# Patient Record
Sex: Female | Born: 1940 | Race: Black or African American | Hispanic: No | Smoking: Former smoker
Health system: Southern US, Community
[De-identification: ages and names within clinical notes are randomized; demographics above are authoritative.]

## PROBLEM LIST (undated history)

## (undated) DIAGNOSIS — K219 Gastro-esophageal reflux disease without esophagitis: Secondary | ICD-10-CM

## (undated) DIAGNOSIS — E119 Type 2 diabetes mellitus without complications: Secondary | ICD-10-CM

## (undated) DIAGNOSIS — N189 Chronic kidney disease, unspecified: Secondary | ICD-10-CM

## (undated) DIAGNOSIS — E785 Hyperlipidemia, unspecified: Secondary | ICD-10-CM

## (undated) DIAGNOSIS — I2699 Other pulmonary embolism without acute cor pulmonale: Secondary | ICD-10-CM

## (undated) DIAGNOSIS — R002 Palpitations: Secondary | ICD-10-CM

## (undated) DIAGNOSIS — M797 Fibromyalgia: Secondary | ICD-10-CM

## (undated) DIAGNOSIS — M1990 Unspecified osteoarthritis, unspecified site: Secondary | ICD-10-CM

## (undated) DIAGNOSIS — I10 Essential (primary) hypertension: Secondary | ICD-10-CM

## (undated) DIAGNOSIS — C801 Malignant (primary) neoplasm, unspecified: Secondary | ICD-10-CM

## (undated) HISTORY — DX: Fibromyalgia: M79.7

## (undated) HISTORY — PX: LEG SURGERY: SHX1003

## (undated) HISTORY — PX: OTHER SURGICAL HISTORY: SHX169

## (undated) HISTORY — PX: ABDOMINAL HYSTERECTOMY: SHX81

## (undated) HISTORY — DX: Hyperlipidemia, unspecified: E78.5

## (undated) HISTORY — PX: HAND SURGERY: SHX662

---

## 1998-01-28 ENCOUNTER — Other Ambulatory Visit: Admission: RE | Admit: 1998-01-28 | Discharge: 1998-01-28 | Payer: Self-pay | Admitting: Internal Medicine

## 1998-12-31 ENCOUNTER — Other Ambulatory Visit: Admission: RE | Admit: 1998-12-31 | Discharge: 1998-12-31 | Payer: Self-pay | Admitting: Internal Medicine

## 1999-09-17 ENCOUNTER — Ambulatory Visit (HOSPITAL_COMMUNITY): Admission: RE | Admit: 1999-09-17 | Discharge: 1999-09-17 | Payer: Self-pay | Admitting: Geriatric Medicine

## 2000-01-14 ENCOUNTER — Encounter: Payer: Self-pay | Admitting: Internal Medicine

## 2000-01-14 ENCOUNTER — Encounter: Admission: RE | Admit: 2000-01-14 | Discharge: 2000-01-14 | Payer: Self-pay | Admitting: Internal Medicine

## 2001-01-16 ENCOUNTER — Encounter: Admission: RE | Admit: 2001-01-16 | Discharge: 2001-01-16 | Payer: Self-pay | Admitting: Internal Medicine

## 2001-01-16 ENCOUNTER — Encounter: Payer: Self-pay | Admitting: Internal Medicine

## 2002-01-17 ENCOUNTER — Encounter: Admission: RE | Admit: 2002-01-17 | Discharge: 2002-01-17 | Payer: Self-pay | Admitting: Internal Medicine

## 2002-01-17 ENCOUNTER — Encounter: Payer: Self-pay | Admitting: Internal Medicine

## 2002-01-28 ENCOUNTER — Encounter: Payer: Self-pay | Admitting: Internal Medicine

## 2002-01-28 ENCOUNTER — Ambulatory Visit (HOSPITAL_COMMUNITY): Admission: RE | Admit: 2002-01-28 | Discharge: 2002-01-28 | Payer: Self-pay | Admitting: Internal Medicine

## 2002-08-12 ENCOUNTER — Emergency Department (HOSPITAL_COMMUNITY): Admission: EM | Admit: 2002-08-12 | Discharge: 2002-08-12 | Payer: Self-pay | Admitting: Emergency Medicine

## 2003-03-17 ENCOUNTER — Ambulatory Visit (HOSPITAL_COMMUNITY): Admission: RE | Admit: 2003-03-17 | Discharge: 2003-03-17 | Payer: Self-pay | Admitting: Gastroenterology

## 2004-01-14 ENCOUNTER — Other Ambulatory Visit: Admission: RE | Admit: 2004-01-14 | Discharge: 2004-01-14 | Payer: Self-pay | Admitting: Internal Medicine

## 2004-08-10 ENCOUNTER — Encounter: Admission: RE | Admit: 2004-08-10 | Discharge: 2004-08-10 | Payer: Self-pay | Admitting: Internal Medicine

## 2005-10-24 ENCOUNTER — Inpatient Hospital Stay (HOSPITAL_COMMUNITY): Admission: AD | Admit: 2005-10-24 | Discharge: 2005-10-28 | Payer: Self-pay | Admitting: Internal Medicine

## 2005-10-24 ENCOUNTER — Encounter: Admission: RE | Admit: 2005-10-24 | Discharge: 2005-10-24 | Payer: Self-pay | Admitting: Internal Medicine

## 2005-12-25 ENCOUNTER — Emergency Department (HOSPITAL_COMMUNITY): Admission: EM | Admit: 2005-12-25 | Discharge: 2005-12-25 | Payer: Self-pay | Admitting: Emergency Medicine

## 2006-10-27 ENCOUNTER — Encounter: Admission: RE | Admit: 2006-10-27 | Discharge: 2006-10-27 | Payer: Self-pay | Admitting: Internal Medicine

## 2006-12-27 ENCOUNTER — Other Ambulatory Visit: Admission: RE | Admit: 2006-12-27 | Discharge: 2006-12-27 | Payer: Self-pay | Admitting: Internal Medicine

## 2007-01-18 ENCOUNTER — Encounter: Admission: RE | Admit: 2007-01-18 | Discharge: 2007-01-18 | Payer: Self-pay | Admitting: Internal Medicine

## 2008-09-09 ENCOUNTER — Encounter: Admission: RE | Admit: 2008-09-09 | Discharge: 2008-09-09 | Payer: Self-pay | Admitting: Internal Medicine

## 2009-12-03 ENCOUNTER — Encounter: Admission: RE | Admit: 2009-12-03 | Discharge: 2009-12-03 | Payer: Self-pay | Admitting: Internal Medicine

## 2010-10-26 ENCOUNTER — Other Ambulatory Visit: Payer: Self-pay | Admitting: Obstetrics and Gynecology

## 2010-10-26 ENCOUNTER — Other Ambulatory Visit: Payer: Self-pay | Admitting: Urology

## 2010-10-26 ENCOUNTER — Encounter (HOSPITAL_COMMUNITY): Payer: Medicare Other

## 2010-10-26 LAB — CBC
Platelets: 231 10*3/uL (ref 150–400)
RBC: 3.99 MIL/uL (ref 3.87–5.11)
RDW: 13.5 % (ref 11.5–15.5)
WBC: 6.7 10*3/uL (ref 4.0–10.5)

## 2010-10-26 LAB — BASIC METABOLIC PANEL
BUN: 27 mg/dL — ABNORMAL HIGH (ref 6–23)
Calcium: 10.5 mg/dL (ref 8.4–10.5)
Chloride: 100 mEq/L (ref 96–112)
Creatinine, Ser: 1.27 mg/dL — ABNORMAL HIGH (ref 0.4–1.2)
GFR calc Af Amer: 51 mL/min — ABNORMAL LOW (ref >60)
GFR calc non Af Amer: 42 mL/min — ABNORMAL LOW (ref >60)

## 2010-10-26 LAB — PROTIME-INR
INR: 0.93 (ref 0.00–1.49)
Prothrombin Time: 12.7 seconds (ref 11.6–15.2)

## 2010-10-26 LAB — SURGICAL PCR SCREEN: Staphylococcus aureus: POSITIVE — AB

## 2010-10-26 LAB — APTT: aPTT: 32 seconds (ref 24–37)

## 2010-11-02 ENCOUNTER — Other Ambulatory Visit: Payer: Self-pay | Admitting: Obstetrics and Gynecology

## 2010-11-02 ENCOUNTER — Ambulatory Visit (HOSPITAL_COMMUNITY): Admission: AD | Admit: 2010-11-02 | Payer: Self-pay | Source: Ambulatory Visit | Admitting: Obstetrics and Gynecology

## 2010-11-02 ENCOUNTER — Ambulatory Visit (HOSPITAL_COMMUNITY)
Admission: RE | Admit: 2010-11-02 | Discharge: 2010-11-03 | Disposition: A | Discharge status: Home / Self Care | Payer: Medicare Other | Source: Ambulatory Visit | Attending: Obstetrics and Gynecology | Admitting: Obstetrics and Gynecology

## 2010-11-02 ENCOUNTER — Ambulatory Visit (HOSPITAL_COMMUNITY): Payer: Medicare Other

## 2010-11-02 DIAGNOSIS — Z01818 Encounter for other preprocedural examination: Secondary | ICD-10-CM | POA: Insufficient documentation

## 2010-11-02 DIAGNOSIS — IMO0002 Reserved for concepts with insufficient information to code with codable children: Secondary | ICD-10-CM

## 2010-11-02 DIAGNOSIS — Z01812 Encounter for preprocedural laboratory examination: Secondary | ICD-10-CM | POA: Insufficient documentation

## 2010-11-02 DIAGNOSIS — N880 Leukoplakia of cervix uteri: Secondary | ICD-10-CM | POA: Insufficient documentation

## 2010-11-02 DIAGNOSIS — N812 Incomplete uterovaginal prolapse: Secondary | ICD-10-CM | POA: Insufficient documentation

## 2010-11-02 DIAGNOSIS — N80209 Endometriosis of unspecified fallopian tube, unspecified depth: Secondary | ICD-10-CM | POA: Insufficient documentation

## 2010-11-02 DIAGNOSIS — N802 Endometriosis of fallopian tube: Secondary | ICD-10-CM | POA: Insufficient documentation

## 2010-11-02 DIAGNOSIS — N9489 Other specified conditions associated with female genital organs and menstrual cycle: Secondary | ICD-10-CM | POA: Insufficient documentation

## 2010-11-02 LAB — BASIC METABOLIC PANEL
Calcium: 9.6 mg/dL (ref 8.4–10.5)
Chloride: 105 mEq/L (ref 96–112)
Creatinine, Ser: 1.15 mg/dL (ref 0.4–1.2)
GFR calc Af Amer: 57 mL/min — ABNORMAL LOW (ref >60)
Sodium: 139 mEq/L (ref 135–145)

## 2010-11-02 LAB — CBC
MCH: 29.9 pg (ref 26.0–34.0)
MCHC: 33.3 g/dL (ref 30.0–36.0)
Platelets: 194 10*3/uL (ref 150–400)
RBC: 3.48 MIL/uL — ABNORMAL LOW (ref 3.87–5.11)

## 2010-11-02 LAB — GLUCOSE, CAPILLARY: Glucose-Capillary: 209 mg/dL — ABNORMAL HIGH (ref 70–99)

## 2010-11-02 LAB — TYPE AND SCREEN

## 2010-11-03 LAB — BASIC METABOLIC PANEL
Chloride: 106 mEq/L (ref 96–112)
GFR calc Af Amer: 52 mL/min — ABNORMAL LOW (ref >60)
Potassium: 4.3 mEq/L (ref 3.5–5.1)

## 2010-11-03 LAB — CBC
Platelets: 202 10*3/uL (ref 150–400)
RBC: 3.24 MIL/uL — ABNORMAL LOW (ref 3.87–5.11)
WBC: 12.7 10*3/uL — ABNORMAL HIGH (ref 4.0–10.5)

## 2010-11-05 NOTE — Op Note (Signed)
   NAME:  Robyn Mathews, Robyn Mathews NO.:  [id number]   MEDICAL RECORD NO.:  [id number]                   PATIENT TYPE:  AMB   LOCATION:  ENDO                                 FACILITY:  [facility]   PHYSICIAN:  Danise Edge, M.D.                DATE OF BIRTH:  03-16-1941   DATE OF PROCEDURE:  03/17/2003  DATE OF DISCHARGE:                                 OPERATIVE REPORT   PROCEDURE:  Screening incomplete colonoscopy.   INDICATIONS FOR PROCEDURE:  Robyn Mathews is a 70 year old female born  Feb 16, 1941. Robyn Mathews is scheduled to undergo a screening colonoscopy with  polypectomy to prevent colon cancer.   ENDOSCOPIST:  Charolett Bumpers, M.D.   PREMEDICATION:  Versed 10 mg, Demerol 100 mg .   DESCRIPTION OF PROCEDURE:  After obtaining informed consent, Robyn Mathews was  placed in the left lateral decubitus position. I administered intravenous  Demerol and intravenous Versed to achieve conscious sedation for the  procedure. The patient's blood pressure, oxygen saturation and cardiac  rhythm were monitored throughout the procedure and documented in the medical  record.   Anal inspection was normal. Digital rectal exam was normal. The Olympus  adjustable pediatric colonoscope was introduced into the rectum and advanced  to the proximal ascending colon. Due to left colonic loop formation, I was  unable to intubate the cecum and examine the cecum endoscopically.  Colonic  preparation for the exam today was excellent.   RECTUM:  Normal.   SIGMOID COLON AND DESCENDING COLON:  Normal. A few small diverticula are  present.   SPLENIC FLEXURE:  Normal.   TRANSVERSE COLON:  Normal.   HEPATIC FLEXURE:  Normal.   ASCENDING COLON:  Normal.   CECUM AND ILEOCECAL VALVE:  Not examined.   ASSESSMENT:  Normal screening proctocolonoscopy to the proximal ascending  colon. I was unable to examine the cecum and ileocecal valve.    RECOMMENDATIONS:  Perform stool Hemoccult  testing. If stool Hemoccults are  positive for blood, Robyn Mathews should undergo a virtual colonoscopy performed  at [facility].  If stool guaiac slides are negative, Robyn Mathews should undergo a  repeat screening optical colonoscopy for a virtual colonoscopy in five  years.                                               Danise Edge, M.D.    MJ/MEDQ  D:  03/17/2003  T:  03/17/2003  Job:  [id number]

## 2010-11-05 NOTE — H&P (Signed)
NAMEMICA, RAMDASS NO.:  [id number]   MEDICAL RECORD NO.:  [id number]           PATIENT TYPE:   LOCATION:                                 FACILITY:   PHYSICIAN:  Georgann Housekeeper, MD      DATE OF BIRTH:  03/06/1941   DATE OF ADMISSION:  DATE OF DISCHARGE:                                HISTORY & PHYSICAL   CHIEF COMPLAINT:  Right mid back pain.   HISTORY OF PRESENT ILLNESS:  Robyn Mathews is a very pleasant 70 year old  patient of Dr. Georgann Housekeeper with a two week history of right mid back pain.  States pain has been constant, does not radiate, and is occasionally worse  with movement.  It is not pleuritic, and she is not short of breath.  She  wondered whether she strained her back but could not remember any particular  injury.  States that also during this time, she has had palpitations but  denies any chest pain, pressure, or peripheral edema.  Also denies any leg  pain.   In the office, she was found to be in sinus tachycardia with a heart rate of  115.  Subsequently, a spiral CT of the chest was obtained, which revealed a  small, nonocclusive pulmonary embolus in the branch of the left upper lobe  pulmonary artery.  She is being admitted for the same.   REVIEW OF SYSTEMS:  Denies any fevers or chills.  CHEST:  As above.  ABDOMEN:  No nausea, vomiting, or diarrhea.  GU:  Denies symptoms.  EXTREMITIES:  No edema or pain.   PAST MEDICAL HISTORY:  1.  Hypertension.  2.  Diabetes mellitus type 2.  3.  Osteoarthritis.  4.  Myxoid liposarcoma.  5.  Sickle cell trait.  6.  Hematuria, worked up by Dr. Logan Bores, urology with papillary necrosis.   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 Elesa Massed at [facility].   MEDICATIONS:  1.  Glucophage 500 mg 1 p.o. b.i.d.  2.  Aspirin 81 mg daily.  3.  Altace 5 mg 1 p.o. b.i.d.  4.  HCTZ 25 mg 1 p.o. daily.  5.  Calcium with vitamin D 1000 mg daily.   DRUG ALLERGIES:   AMOXICILLIN.   SOCIAL HISTORY:  Does not smoke.  No ETOH.  She is a retired Engineer, site.   FAMILY HISTORY:  Father died with liver problems at age 15.  Mother died  with old age at age 40.  One sister died of cerebral aneurysm in her 30s.  There is a family history of siblings with diabetes mellitus.   OBJECTIVE:  VITAL SIGNS:  Temp 99.9, pulse 110, blood pressure 180/82,  respirations 22, O2 sats 98% on room air.  GENERAL:  She is alert and oriented x3.  SKIN:  Warm and dry.  HEENT:  Eyes:  PERRLA.  EOMs are intact.  Ears, nose, and throat are  unremarkable.  NECK:  No JVD.  HEART:  Rapid S1 and S2 with heart rate of 115.  No murmur or  rub  auscultated.  LUNGS:  Clear.  ABDOMEN:  Nontender.  EXTREMITIES:  No edema.  No cords felt.  NEURO:  Cranial nerves II-XII are intact.  Gait steady.  Speech appropriate.   EKG reveals sinus tachycardia with a heart rate of 115.   CBC:  Hemoglobin 12.6, hematocrit 37.3, RBCs 4.08, MCV 91.5, MCHC 33.8, RDW  12.5, platelets 254,000, white cell count 10,200, neutrophils 72.6%, lymphs  20.6%, TSH 0.75.  Urinalysis reveals trace leukocyte esterase, +2  urobilinogen, 0-5 WBCs, no RBCs.   Spiral CT of the chest with contrast done today reveals a small nonocclusive  PE in the branch of the left upper lobe pulmonary artery.   IMPRESSION:  Pulmonary embolism.   PLAN:  Admit.  Start heparin and Coumadin per pharmacy protocol.  Hold  Glucophage for two days after a contrast media dye given.  Will start  insulin sliding scale.  Dr. Donette Larry and associates to follow.      [name]      Georgann Housekeeper, MD  Electronically Signed    TCL/MEDQ  D:  10/24/2005  T:  10/24/2005  Job:  [id number]

## 2010-11-05 NOTE — Discharge Summary (Signed)
NAMEEVALETTE, MONTROSE NO.:  [id number]   MEDICAL RECORD NO.:  [id number]          PATIENT TYPE:  INP   LOCATION:  6529                         FACILITY:  [facility]   PHYSICIAN:  Georgann Housekeeper, MD      DATE OF BIRTH:  1940-09-24   DATE OF ADMISSION:  10/24/2005  DATE OF DISCHARGE:  10/28/2005                                 DISCHARGE SUMMARY   DISCHARGE DIAGNOSES:  1.  Acute pulmonary embolism on the left pulmonary artery, small.  2.  Type 2 diabetes with mildly elevated blood sugars.  3.  Hypertension.   MEDICATIONS ON DISCHARGE:  1.  Altace 5 mg b.i.d.  2.  Hydrochlorothiazide 25 mg every day.  3.  Glucophage 500 two tablets a day.  4.  Amaryl 2 mg b.i.d. (which is new).  5.  Lovenox injection 80 mg twice a day until the INR becomes therapeutic.  6.  Coumadin 5 mg as directed.   PT INR check on Monday, Oct 31, 2005 at the office.  Follow up with me in  one week.   PROCEDURES DONE:  CT of the chest showed small left upper lobe PE.  Hypercoagulable workup including lupus anticoagulant and antithrombin III  was normal.  As far as the patient remained hemodynamically stable.  Electrolytes and blood chemistries remained stable.  Hemoglobin was 10.8  mildly decreased.   HOSPITAL COURSE:  The patient is 70 years old, history of type 2 diabetes  hypertension came to the office with some pleuritic chest discomfort,  pleuritic pain on the back, upper shoulder blade, and mild tachycardia.  No  dyspnea.  The patient had spiral CT done showing acute small left upper lobe  PE.  She had no risk factors and no prior history of __________  .  No leg  discomfort or swelling.  She does have a history of __________  her thigh  many years ago.  As far as the patient was admitted on telemetry, started on  heparin and Coumadin.  And hemodynamics remained stable.  Tachycardia  resolved.  No chest pains or shortness of breath.  The patient's INR was 1.4  at discharge.  The patient  was continued on Lovenox injections which were  switched from heparin and did well using it and will continue that at home  until the INR becomes therapeutic.  As far as the she her hemoglobin  remained stable.  Dropped down from 11 range to 10.8.  No evidence of any  bleeding.  Platelet count remained stable.  As far as her electrolytes,  remained stable.  Next issue is hypertension, remained good with hydrochlorothiazide and  Altace.  Third issue is diabetes.  Blood sugar remained a little high in the 170  range.  The patient was on Glucophage.  Amaryl was added 2 mg twice a day.  Blood sugar was down to 102.  Will follow that outpatient.   The patient is getting home health and Lovenox injections teaching.  Coumadin teaching was done.   Follow up in one week.      Georgann Housekeeper, MD  Electronically Signed     KH/MEDQ  D:  10/28/2005  T:  10/28/2005  Job:  [id number]

## 2010-11-07 NOTE — Op Note (Signed)
NAME:  Robyn Mathews, Robyn Mathews NO.:  [id number]  MEDICAL RECORD NO.:  [id number]           PATIENT TYPE:  O  LOCATION:  9303                          FACILITY:  [facility]  PHYSICIAN:  Maxie Better, M.D.DATE OF BIRTH:  1941/02/02  DATE OF PROCEDURE:  11/02/2010 DATE OF DISCHARGE:                              OPERATIVE REPORT   PREOPERATIVE DIAGNOSIS:  Second-degree to third-degree uterovaginal prolapse.  PROCEDURES:  Total vaginal hysterectomy, bilateral salpingo- oophorectomy.  POSTOPERATIVE DIAGNOSIS:  Second-degree to third-degree uterovaginal prolapse.  ANESTHESIA:  General.  SURGEON:  Maxie Better, MD  ASSISTANT:  Lenoard Aden, MD  PROCEDURE:  Under adequate general anesthesia, the patient was placed in the dorsal lithotomy position and immediately doing so, the cervix was noted to be prolapsing through the vagina at least 2 to 2-1/2 inches outside the introitus.  The patient was sterilely prepped and draped in usual fashion.  Indwelling Foley catheter was sterilely placed. Examination revealed a small uterus and what appeared to be a short vagina as the uterus had prolapsed.  The uterus was then placed in its anatomic position in order to outline the location of the bladder to vaginal reflection as well as the posterior uterus/cul-de-sac.  Once this was done, dilute solution of Pitressin was injected circumferentially at the cervicovaginal junction.  Cervicovaginal incision was then made circumferentially and Mayo scissors were then utilized to sharply dissect the vaginal mucosa off the cervical attachment.  In doing so, the anterior cul-de-sac was finally opened followed by the posterior cul-de-sac, which was then opened, but tunneled in order to try to give a length to the vagina.  Once the posterior cul-de-sac was opened, the uteroovarian ligaments were bilaterally clamped, cut, and suture ligated with 0 Vicryl.  They were very attenuated.   It was then utilized to serially clamped, cauterized, and cut the cardinal ligaments, utero-ovarian ligaments bilaterally; and when the area of the round ligament was reached, the fundus was then flipped backwards identifying both tubes and ovaries bilaterally.  Both tubes and ovaries, however, were somewhat high in the vagina.  In order to facilitate the surgery, the right uteroovarian ligament and tube were serially clamped, cauterized, and cut until the uterus was separated from its attachment to the right tube and ovary.  On the left, the infant IP ligament was serially clamped, cauterized, and cut until the left tube and ovary was then removed along with its attachment to the uterus.  Once that was done, the right tube and ovary, which had been clamped and cut and held on a clamp was then removed using serial clamping and cauterizing and cutting of its attachment to the right side wall.  At that point, Dr. McDiarmid was called.  Please see his dictated operative note for the remainder of the procedure that was done, which does include the vaginal vault suspension.  Estimated blood loss from the vaginal portion of my surgery was minimal.  Complication was none, and the patient was transferred to recovery room after the urological portion of surgery was done.     Maxie Better, M.D.     [place]/MEDQ  D:  11/02/2010  T:  11/03/2010  Job:  [id number]  Electronically Signed by Nena Jordan Camden Knotek M.D. on 11/07/2010 02:49:27 PM

## 2010-11-15 NOTE — Op Note (Signed)
NAME:  Robyn Mathews, Robyn Mathews NO.:  [id number]  MEDICAL RECORD NO.:  [id number]           PATIENT TYPE:  O  LOCATION:  [facility]                          FACILITY:  [facility]  PHYSICIAN:  Robyn Sinner, MD DATE OF BIRTH:  1940-09-17  DATE OF PROCEDURE:  11/02/2010 DATE OF DISCHARGE:                              OPERATIVE REPORT   SURGEON:  Robyn Sinner, MD.  ASSISTANT.:  Delia Chimes, NP  PREOPERATIVE DIAGNOSIS:  Uterine wall prolapse plus cystocele repair.  SURGERY:  Wall prolapse repair plus cystocele repair plus graft plus cystoscopy.  INDICATIONS:  Robyn Mathews had a hysterectomy performed by Dr. Maxie Better.  Vaginal cuff was left open.  To try to preserve vaginal length, Dr. Cherly Hensen left quite a bit of vaginal mucosa posteriorly.  The vaginal cuff was opened when I entered the room.  Uterosacral ligament were very flimsy and tagged.  Leg position was good.  Preoperative antibiotics were given.  With 2 Allis clamps placed on the anterior cuff, I made an anterior vaginal wall incision and sharply and bluntly dissected the underlying pubocervical fascia from the anterior vaginal wall to the pelvic sidewall bilaterally.  I had instilled approximately 18 cc of lidocaine epinephrine mixture prior.  There was no question the patient had a trap door deformity as well as the central defect.  I did an anatomic two-layer closure, i.e. an anterior repair reducing her central defect.  I then cystoscoped the patient.  There was excellent blue jets of indigo carmine bilaterally.  At this point, I bluntly dissected at the level of the vaginal cuff along the pelvic sidewall to feel the ischial spine bilaterally.  She had a flat ischial spine bilaterally.  Her sacrospinous ligament was little bit thicker on the left than on the right.  Using a Capio device, I placed 0 Ethibond one fingerbreadth medial to the ischial spine bilaterally in a straight line between 2  spines.  I double checked the position and I was very pleased with it.  I did a rectal examination and the sutures were away from the rectum.  There was no injury to rectum.  Using my usual technique, I placed a 0 Vicryl on a UR-6 needle through the pelvic sidewall bilaterally at the level of the urethrovesical angle.  I tapered a 10 x 6 dermal graft in the shape of a trapezoid.  I passed all 4 sutures through anatomically and the graft laid in very nicely, not under tension.  I sutured the apical aspect of my anterior repair with 2 interrupted 2-0 Vicryls to the midline of the graft.  I took down my retractors and studied her anatomy.  Utilizing a digital rectal examination, I felt that her posterior support of the rectum was actually very good but she had a lot of redundant prolapsing posterior vaginal wall mucosa.  Thinking this may be the case, I left her left Ethibond vault suspension suture in its full length.  I took down any rectovaginal fascia from the posterior vaginal wall at the level of the cuff allowing me to set the position where I was  going to suspend the redundant mucosa to her left ischial spine.  I brought the 0 Ethibond full-thickness through on her left side and sewed it down just leaving a little bit of an air knot.  This flattened the posterior vaginal wall very well and reduced her redundant mucosa.  I then cut the suture.  Uterosacral ligaments sutures were not tied together.  There was really not much strength to their tissue.  The vaginal cuff was closed with 2-0 Vicryl.  I was very pleased with the length of her vagina and the repair at the end of the case.  One-half vaginal packs with Estrace cream were applied.  At the end of the case, one of the blue stay sutures from my double ring retractor could not be found.  Approximately 30 minutes of work was utilized to finally find it.  We brought the C-arm into the room but found it just before we x-rayed  the patient.  Leg positioning was good at the end of the case.  Blue urine was drained at the end of the case.  Blood loss was less than 50 mL.  Hopefully this operation will reach [name] treatment goal.          ______________________________ Robyn Sinner, MD     SAM/MEDQ  D:  11/02/2010  T:  11/02/2010  Job:  [id number]  Electronically Signed by Robyn Martinez MD on 11/15/2010 06:14:43 PM

## 2010-12-20 ENCOUNTER — Other Ambulatory Visit: Payer: Self-pay | Admitting: Internal Medicine

## 2010-12-20 DIAGNOSIS — Z1231 Encounter for screening mammogram for malignant neoplasm of breast: Secondary | ICD-10-CM

## 2010-12-28 ENCOUNTER — Ambulatory Visit
Admission: RE | Admit: 2010-12-28 | Discharge: 2010-12-28 | Disposition: A | Discharge status: Home / Self Care | Payer: Medicare Other | Source: Ambulatory Visit | Attending: Internal Medicine | Admitting: Internal Medicine

## 2010-12-28 DIAGNOSIS — Z1231 Encounter for screening mammogram for malignant neoplasm of breast: Secondary | ICD-10-CM

## 2011-04-27 ENCOUNTER — Other Ambulatory Visit: Payer: Self-pay | Admitting: Internal Medicine

## 2011-04-27 ENCOUNTER — Ambulatory Visit
Admission: RE | Admit: 2011-04-27 | Discharge: 2011-04-27 | Disposition: A | Discharge status: Home / Self Care | Payer: Medicare Other | Source: Ambulatory Visit | Attending: Internal Medicine | Admitting: Internal Medicine

## 2011-04-27 DIAGNOSIS — M79603 Pain in arm, unspecified: Secondary | ICD-10-CM

## 2011-12-30 ENCOUNTER — Other Ambulatory Visit: Payer: Self-pay | Admitting: Internal Medicine

## 2011-12-30 DIAGNOSIS — Z1231 Encounter for screening mammogram for malignant neoplasm of breast: Secondary | ICD-10-CM

## 2012-01-17 ENCOUNTER — Ambulatory Visit: Payer: Medicare Other

## 2012-01-27 ENCOUNTER — Ambulatory Visit
Admission: RE | Admit: 2012-01-27 | Discharge: 2012-01-27 | Disposition: A | Discharge status: Home / Self Care | Payer: Medicare Other | Source: Ambulatory Visit | Attending: Internal Medicine | Admitting: Internal Medicine

## 2012-01-27 DIAGNOSIS — Z1231 Encounter for screening mammogram for malignant neoplasm of breast: Secondary | ICD-10-CM

## 2013-02-04 ENCOUNTER — Other Ambulatory Visit: Payer: Self-pay

## 2013-02-04 DIAGNOSIS — Z1231 Encounter for screening mammogram for malignant neoplasm of breast: Secondary | ICD-10-CM

## 2013-02-15 ENCOUNTER — Ambulatory Visit
Admission: RE | Admit: 2013-02-15 | Discharge: 2013-02-15 | Disposition: A | Discharge status: Home / Self Care | Payer: Medicare PPO | Source: Ambulatory Visit

## 2013-02-15 DIAGNOSIS — Z1231 Encounter for screening mammogram for malignant neoplasm of breast: Secondary | ICD-10-CM

## 2013-04-08 ENCOUNTER — Other Ambulatory Visit: Payer: Self-pay | Admitting: Gastroenterology

## 2013-04-15 ENCOUNTER — Encounter (HOSPITAL_COMMUNITY): Payer: Self-pay | Admitting: Pharmacy Technician

## 2013-04-19 ENCOUNTER — Encounter (HOSPITAL_COMMUNITY): Payer: Self-pay | Admitting: *Deleted

## 2013-05-07 ENCOUNTER — Encounter (HOSPITAL_COMMUNITY): Payer: Self-pay | Admitting: *Deleted

## 2013-05-07 ENCOUNTER — Ambulatory Visit (HOSPITAL_COMMUNITY): Payer: Medicare PPO | Admitting: Anesthesiology

## 2013-05-07 ENCOUNTER — Ambulatory Visit (HOSPITAL_COMMUNITY)
Admission: RE | Admit: 2013-05-07 | Discharge: 2013-05-07 | Disposition: A | Discharge status: Home / Self Care | Payer: Medicare PPO | Source: Ambulatory Visit | Attending: Gastroenterology | Admitting: Gastroenterology

## 2013-05-07 ENCOUNTER — Encounter (HOSPITAL_COMMUNITY): Payer: Medicare PPO | Admitting: Anesthesiology

## 2013-05-07 ENCOUNTER — Encounter (HOSPITAL_COMMUNITY)
Admission: RE | Disposition: A | Discharge status: Home / Self Care | Payer: Medicare PPO | Source: Ambulatory Visit | Attending: Gastroenterology

## 2013-05-07 DIAGNOSIS — E7800 Pure hypercholesterolemia, unspecified: Secondary | ICD-10-CM | POA: Insufficient documentation

## 2013-05-07 DIAGNOSIS — E119 Type 2 diabetes mellitus without complications: Secondary | ICD-10-CM | POA: Insufficient documentation

## 2013-05-07 DIAGNOSIS — I10 Essential (primary) hypertension: Secondary | ICD-10-CM | POA: Insufficient documentation

## 2013-05-07 DIAGNOSIS — Z86711 Personal history of pulmonary embolism: Secondary | ICD-10-CM | POA: Insufficient documentation

## 2013-05-07 DIAGNOSIS — K219 Gastro-esophageal reflux disease without esophagitis: Secondary | ICD-10-CM | POA: Insufficient documentation

## 2013-05-07 DIAGNOSIS — M1990 Unspecified osteoarthritis, unspecified site: Secondary | ICD-10-CM | POA: Insufficient documentation

## 2013-05-07 DIAGNOSIS — Z87891 Personal history of nicotine dependence: Secondary | ICD-10-CM | POA: Insufficient documentation

## 2013-05-07 DIAGNOSIS — Z1211 Encounter for screening for malignant neoplasm of colon: Secondary | ICD-10-CM | POA: Insufficient documentation

## 2013-05-07 HISTORY — DX: Essential (primary) hypertension: I10

## 2013-05-07 HISTORY — DX: Unspecified osteoarthritis, unspecified site: M19.90

## 2013-05-07 HISTORY — DX: Type 2 diabetes mellitus without complications: E11.9

## 2013-05-07 HISTORY — PX: COLONOSCOPY WITH PROPOFOL: SHX5780

## 2013-05-07 HISTORY — DX: Gastro-esophageal reflux disease without esophagitis: K21.9

## 2013-05-07 HISTORY — DX: Chronic kidney disease, unspecified: N18.9

## 2013-05-07 HISTORY — DX: Malignant (primary) neoplasm, unspecified: C80.1

## 2013-05-07 HISTORY — DX: Palpitations: R00.2

## 2013-05-07 HISTORY — DX: Other pulmonary embolism without acute cor pulmonale: I26.99

## 2013-05-07 LAB — GLUCOSE, CAPILLARY: Glucose-Capillary: 136 mg/dL — ABNORMAL HIGH (ref 70–99)

## 2013-05-07 SURGERY — COLONOSCOPY WITH PROPOFOL
Anesthesia: Monitor Anesthesia Care

## 2013-05-07 MED ORDER — LACTATED RINGERS IV SOLN
INTRAVENOUS | Status: DC | PRN
  Administered 2013-05-07: 09:00:00 via INTRAVENOUS

## 2013-05-07 MED ORDER — SODIUM CHLORIDE 0.9 % IV SOLN: INTRAVENOUS | Status: DC

## 2013-05-07 MED ORDER — PROPOFOL INFUSION 10 MG/ML OPTIME
INTRAVENOUS | Status: DC | PRN
  Administered 2013-05-07: 140 ug/kg/min via INTRAVENOUS

## 2013-05-07 MED ORDER — PROPOFOL 10 MG/ML IV BOLUS
INTRAVENOUS | Status: DC | PRN
  Administered 2013-05-07: 40 mg via INTRAVENOUS

## 2013-05-07 MED ORDER — LACTATED RINGERS IV SOLN
INTRAVENOUS | Status: DC
  Administered 2013-05-07: 09:00:00 via INTRAVENOUS

## 2013-05-07 SURGICAL SUPPLY — 22 items

## 2013-05-07 NOTE — Anesthesia Preprocedure Evaluation (Addendum)
Anesthesia Evaluation  Patient identified by MRN, date of birth, ID band Patient awake    Reviewed: Allergy & Precautions, H&P , NPO status , Patient's Chart, lab work & pertinent test results  Airway Mallampati: II      Dental  (+) Edentulous Upper and Partial Lower   Pulmonary former smoker,  breath sounds clear to auscultation  Pulmonary exam normal       Cardiovascular hypertension, Pt. on medications Rhythm:Regular Rate:Normal     Neuro/Psych negative neurological ROS  negative psych ROS   GI/Hepatic Neg liver ROS, GERD-  ,  Endo/Other  diabetes, Type 2, Oral Hypoglycemic Agents  Renal/GU Renal InsufficiencyRenal disease  negative genitourinary   Musculoskeletal negative musculoskeletal ROS (+)   Abdominal   Peds  Hematology negative hematology ROS (+)   Anesthesia Other Findings   Reproductive/Obstetrics                         Anesthesia Physical Anesthesia Plan  ASA: II  Anesthesia Plan: MAC   Post-op Pain Management:    Induction: Intravenous  Airway Management Planned: Simple Face Mask  Additional Equipment:   Intra-op Plan:   Post-operative Plan:   Informed Consent: I have reviewed the patients History and Physical, chart, labs and discussed the procedure including the risks, benefits and alternatives for the proposed anesthesia with the patient or authorized representative who has indicated his/her understanding and acceptance.   Dental advisory given  Plan Discussed with: CRNA  Anesthesia Plan Comments:         Anesthesia Quick Evaluation

## 2013-05-07 NOTE — H&P (Signed)
  Procedure: Screening colonoscopy  History: The patient is a 72 year old female born Aug 13, 1940. On 03/17/2003, the patient underwent a normal screening incomplete colonoscopy to the proximal ascending colon.  The patient is scheduled to undergo a repeat screening colonoscopy today.  Past medical history: Hypertension. Type 2 diabetes mellitus. Osteoarthritis. Gastroesophageal reflux. Hypercholesterolemia. History of pulmonary embolism. Fibromyalgia syndrome. Microscopic hematuria. Liposarcoma removed from the right leg. Osteopenia. Uterine prolapse. Urinary bladder incontinence. Total abdominal hysterectomy. Bilateral salpingo-oophorectomy. Gout. Bladder tuck surgery.  Allergies: Amoxicillin caused rash. Zocor caused elevation in the muscle enzymes. Tramadol caused nausea.  Exam: The patient is alert and lying comfortably on the endoscopy stretcher. Abdomen is soft and nontender to palpation. Lungs are clear to auscultation. Cardiac exam reveals a regular rhythm  Plan: Proceed with screening colonoscopy.

## 2013-05-07 NOTE — Progress Notes (Addendum)
Pt c/o suprapubic pain a 5/10. MD made aware. Pt describes the pain as a piercing pain.

## 2013-05-07 NOTE — Op Note (Signed)
Procedure: Screening colonoscopy.  Endoscopist: Danise Edge  Premedication: Propofol administered by anesthesia  Procedure: The patient was placed in the left lateral decubitus position. Anal inspection and digital rectal exam were normal. The Pentax pediatric colonoscope was introduced into the rectum and advanced to the cecum. A normal-appearing appendiceal orifice was identified. Colonic preparation for the exam today was good.  Rectum. Normal. Retroflexed view of the distal rectum normal  Sigmoid colon and descending colon. Normal  Splenic flexure. Normal  Transverse colon. Normal  Hepatic flexure. Normal  Ascending colon. Normal  Cecum and ileocecal valve. Normal  Assessment: Normal screening proctocolonoscopy to the cecum.

## 2013-05-07 NOTE — Transfer of Care (Signed)
Immediate Anesthesia Transfer of Care Note  Patient: Robyn Mathews  Procedure(s) Performed: Procedure(s): COLONOSCOPY WITH PROPOFOL (N/A)  Patient Location: PACU and Endoscopy Unit  Anesthesia Type:MAC  Level of Consciousness: awake and alert   Airway & Oxygen Therapy: Patient Spontanous Breathing and Patient connected to face mask oxygen  Post-op Assessment: Report given to PACU RN and Post -op Vital signs reviewed and stable  Post vital signs: Reviewed and stable  Complications: No apparent anesthesia complications

## 2013-05-08 ENCOUNTER — Encounter (HOSPITAL_COMMUNITY): Payer: Self-pay | Admitting: Gastroenterology

## 2013-05-08 NOTE — Anesthesia Postprocedure Evaluation (Signed)
Anesthesia Post Note  Patient: Robyn Mathews  Procedure(s) Performed: Procedure(s) (LRB): COLONOSCOPY WITH PROPOFOL (N/A)  Anesthesia type: MAC  Patient location: PACU  Post pain: Pain level controlled  Post assessment: Post-op Vital signs reviewed  Last Vitals:  Filed Vitals:   05/07/13 1026  BP: 165/78  Pulse:   Temp:   Resp:     Post vital signs: Reviewed  Level of consciousness: sedated  Complications: No apparent anesthesia complications

## 2013-08-29 ENCOUNTER — Encounter (HOSPITAL_COMMUNITY): Payer: Self-pay | Admitting: Emergency Medicine

## 2013-08-29 ENCOUNTER — Emergency Department (HOSPITAL_COMMUNITY)
Admission: EM | Admit: 2013-08-29 | Discharge: 2013-08-29 | Disposition: A | Discharge status: Home / Self Care | Payer: Medicare PPO | Attending: Emergency Medicine | Admitting: Emergency Medicine

## 2013-08-29 DIAGNOSIS — Z79899 Other long term (current) drug therapy: Secondary | ICD-10-CM | POA: Insufficient documentation

## 2013-08-29 DIAGNOSIS — Z859 Personal history of malignant neoplasm, unspecified: Secondary | ICD-10-CM | POA: Insufficient documentation

## 2013-08-29 DIAGNOSIS — M12869 Other specific arthropathies, not elsewhere classified, unspecified knee: Secondary | ICD-10-CM | POA: Insufficient documentation

## 2013-08-29 DIAGNOSIS — Z880 Allergy status to penicillin: Secondary | ICD-10-CM | POA: Insufficient documentation

## 2013-08-29 DIAGNOSIS — E119 Type 2 diabetes mellitus without complications: Secondary | ICD-10-CM | POA: Insufficient documentation

## 2013-08-29 DIAGNOSIS — R002 Palpitations: Secondary | ICD-10-CM | POA: Insufficient documentation

## 2013-08-29 DIAGNOSIS — N183 Chronic kidney disease, stage 3 unspecified: Secondary | ICD-10-CM | POA: Insufficient documentation

## 2013-08-29 DIAGNOSIS — I129 Hypertensive chronic kidney disease with stage 1 through stage 4 chronic kidney disease, or unspecified chronic kidney disease: Secondary | ICD-10-CM | POA: Insufficient documentation

## 2013-08-29 DIAGNOSIS — Z87891 Personal history of nicotine dependence: Secondary | ICD-10-CM | POA: Insufficient documentation

## 2013-08-29 DIAGNOSIS — IMO0002 Reserved for concepts with insufficient information to code with codable children: Secondary | ICD-10-CM

## 2013-08-29 DIAGNOSIS — M109 Gout, unspecified: Secondary | ICD-10-CM | POA: Insufficient documentation

## 2013-08-29 DIAGNOSIS — Z7982 Long term (current) use of aspirin: Secondary | ICD-10-CM | POA: Insufficient documentation

## 2013-08-29 DIAGNOSIS — Z86711 Personal history of pulmonary embolism: Secondary | ICD-10-CM | POA: Insufficient documentation

## 2013-08-29 LAB — CBC
HEMATOCRIT: 36.6 % (ref 36.0–46.0)
Hemoglobin: 12.6 g/dL (ref 12.0–15.0)
MCH: 30.7 pg (ref 26.0–34.0)
MCHC: 34.4 g/dL (ref 30.0–36.0)
MCV: 89.3 fL (ref 78.0–100.0)
Platelets: 228 10*3/uL (ref 150–400)
RBC: 4.1 MIL/uL (ref 3.87–5.11)
RDW: 13.3 % (ref 11.5–15.5)
WBC: 9.8 10*3/uL (ref 4.0–10.5)

## 2013-08-29 LAB — BASIC METABOLIC PANEL
BUN: 22 mg/dL (ref 6–23)
CO2: 23 mEq/L (ref 19–32)
Calcium: 10.3 mg/dL (ref 8.4–10.5)
Chloride: 100 mEq/L (ref 96–112)
Creatinine, Ser: 1.05 mg/dL (ref 0.50–1.10)
GFR, EST AFRICAN AMERICAN: 60 mL/min — AB (ref >90)
GFR, EST NON AFRICAN AMERICAN: 52 mL/min — AB (ref >90)
Glucose, Bld: 139 mg/dL — ABNORMAL HIGH (ref 70–99)
Potassium: 3.8 mEq/L (ref 3.7–5.3)
Sodium: 139 mEq/L (ref 137–147)

## 2013-08-29 LAB — D-DIMER, QUANTITATIVE: D-Dimer, Quant: <0.27 ug/mL-FEU (ref 0.00–0.48)

## 2013-08-29 LAB — I-STAT TROPONIN, ED: Troponin i, poc: 0 ng/mL (ref 0.00–0.08)

## 2013-08-29 NOTE — ED Notes (Signed)
RN called lab on D-Dimer results delay. Per Lab, results should be in the computer in approximately 10 minutes.Family notified.

## 2013-08-29 NOTE — Discharge Instructions (Signed)
Palpitations   A palpitation is the feeling that your heartbeat is irregular or is faster than normal. It may feel like your heart is fluttering or skipping a beat. Palpitations are usually not a serious problem. However, in some cases, you may need further medical evaluation.  CAUSES   Palpitations can be caused by:   Smoking.   Caffeine or other stimulants, such as diet pills or energy drinks.   Alcohol.   Stress and anxiety.   Strenuous physical activity.   Fatigue.   Certain medicines.   Heart disease, especially if you have a history of arrhythmias. This includes atrial fibrillation, atrial flutter, or supraventricular tachycardia.   An improperly working pacemaker or defibrillator.  DIAGNOSIS   To find the cause of your palpitations, your caregiver will take your history and perform a physical exam. Tests may also be done, including:   Electrocardiography (ECG). This test records the heart's electrical activity.   Cardiac monitoring. This allows your caregiver to monitor your heart rate and rhythm in real time.   Holter monitor. This is a portable device that records your heartbeat and can help diagnose heart arrhythmias. It allows your caregiver to track your heart activity for several days, if needed.   Stress tests by exercise or by giving medicine that makes the heart beat faster.  TREATMENT   Treatment of palpitations depends on the cause of your symptoms and can vary greatly. Most cases of palpitations do not require any treatment other than time, relaxation, and monitoring your symptoms. Other causes, such as atrial fibrillation, atrial flutter, or supraventricular tachycardia, usually require further treatment.  HOME CARE INSTRUCTIONS    Avoid:   Caffeinated coffee, tea, soft drinks, diet pills, and energy drinks.   Chocolate.   Alcohol.   Stop smoking if you smoke.   Reduce your stress and anxiety. Things that can help you relax include:   A method that measures bodily functions so  you can learn to control them (biofeedback).   Yoga.   Meditation.   Physical activity such as swimming, jogging, or walking.   Get plenty of rest and sleep.  SEEK MEDICAL CARE IF:    You continue to have a fast or irregular heartbeat beyond 24 hours.   Your palpitations occur more often.  SEEK IMMEDIATE MEDICAL CARE IF:   You develop chest pain or shortness of breath.   You have a severe headache.   You feel dizzy, or you faint.  MAKE SURE YOU:   Understand these instructions.   Will watch your condition.   Will get help right away if you are not doing well or get worse.  Document Released: 06/03/2000 Document Revised: 10/01/2012 Document Reviewed: 08/05/2011  [employer] Patient Information 2014 [employer]

## 2013-08-29 NOTE — ED Provider Notes (Signed)
CSN: [id number]     Arrival date & time 08/29/13  0114 History   First MD Initiated Contact with Patient 08/29/13 0202     Chief Complaint  Patient presents with  . Palpitations      HPI Patient presents to emergency department with a complaint of palpitations.  No chest pain or shortness of breath.  She states this is occurred intermittently through the week and states that she felt it this evening.  She states she feels somewhat better at this time.  No syncope or near-syncope related to this.  No prior history of atrial fibrillation.  No active chest pain.  No history of coronary disease.  The patient does have a history of prior pulmonary emboli in 2006 and reports this feels somewhat similar tabs she felt at that time.  No unilateral leg swelling.  No recent surgery or long travel.  No other complaints.   Past Medical History  Diagnosis Date  . Diabetes mellitus without complication   . Pulmonary emboli     5'07  . Heart palpitations     occ., not a problem  . Hypertension   . Chronic kidney disease     stage 3 kidney disease-being followed Dr. Deforest Hoyles  . GERD (gastroesophageal reflux disease)   . Arthritis     knees, toes  . Cancer     muscle right leg-surgery   Past Surgical History  Procedure Laterality Date  . Bladder tack    . Leg surgery Right     8'03-growth removed from muscle(cancer)-surgery only  . Abdominal hysterectomy      '13  . Hand surgery Right     growth removed little finger  . Colonoscopy with propofol N/A 05/07/2013    Procedure: COLONOSCOPY WITH PROPOFOL;  Surgeon: Garlan Fair, MD;  Location: [facility];  Service: Endoscopy;  Laterality: N/A;   History reviewed. No pertinent family history. History  Substance Use Topics  . Smoking status: Former Smoker -- 10 years    Types: Cigarettes    Quit date: 04/19/1974  . Smokeless tobacco: Not on file  . Alcohol Use: No   OB History   Grav Para Term Preterm Abortions TAB SAB Ect Mult Living                  Review of Systems  All other systems reviewed and are negative.      Allergies  Amoxicillin  Home Medications   Current Outpatient Rx  Name  Route  Sig  Dispense  Refill  . acetaminophen (TYLENOL) 325 MG tablet   Oral   Take 650 mg by mouth every 6 (six) hours as needed for pain.         Marland Kitchen allopurinol (ZYLOPRIM) 100 MG tablet   Oral   Take 200 mg by mouth daily.         Marland Kitchen amLODipine (NORVASC) 5 MG tablet   Oral   Take 5 mg by mouth daily.         Marland Kitchen aspirin 81 MG tablet   Oral   Take 81 mg by mouth daily.         . hydrochlorothiazide (HYDRODIURIL) 25 MG tablet   Oral   Take 12.5 mg by mouth daily.         . metFORMIN (GLUCOPHAGE) 1000 MG tablet   Oral   Take 500 mg by mouth 2 (two) times daily with a meal.         . ramipril (  ALTACE) 10 MG capsule   Oral   Take 10 mg by mouth daily.          BP 110/53  Pulse 98  Temp(Src) 98.4 F (36.9 C)  Resp 17  Ht 5\' 6"  (1.676 m)  Wt 156 lb 4.8 oz (70.897 kg)  BMI 25.24 kg/m2  SpO2 98% Physical Exam  Nursing note and vitals reviewed. Constitutional: She is oriented to person, place, and time. She appears well-developed and well-nourished. No distress.  HENT:  Head: Normocephalic and atraumatic.  Eyes: EOM are normal.  Neck: Normal range of motion.  Cardiovascular: Regular rhythm and normal heart sounds.   Tachycardia  Pulmonary/Chest: Effort normal and breath sounds normal.  Abdominal: Soft. She exhibits no distension. There is no tenderness.  Musculoskeletal: Normal range of motion.  Neurological: She is alert and oriented to person, place, and time.  Skin: Skin is warm and dry.  Psychiatric: She has a normal mood and affect. Judgment normal.    ED Course  Procedures (including critical care time) Labs Review Labs Reviewed  BASIC METABOLIC PANEL - Abnormal; Notable for the following:    Glucose, Bld 139 (*)    GFR calc non Af Amer 52 (*)    GFR calc Af Amer 60 (*)     All other components within normal limits  CBC  D-DIMER, QUANTITATIVE  I-STAT TROPOININ, ED   Imaging Review No results found.   EKG Interpretation   Date/Time:  Thursday August 29 2013 01:18:05 EDT Ventricular Rate:  130 PR Interval:  134 QRS Duration: 78 QT Interval:  310 QTC Calculation: 456 R Axis:   4 Text Interpretation:  Sinus tachycardia Cannot rule out Inferior infarct ,  age undetermined Abnormal ECG No significant change was found Confirmed by  Aftin Lye  MD, Lennette Bihari ([id number]) on 08/29/2013 1:21:30 AM      MDM   Final diagnoses:  Palpitations    Patient was observed in the emergency department.  Telemetry was reviewed and no abnormal heart rhythms were noted.  She could have paroxysmal atrial fibrillation.  Outpatient cardiology followup.  Patient may benefit from a Holter monitor.  D-dimer negative.  Patient is feeling better at this time.  Heart rate was 95 at time of discharge when I discussed the patient's results with her and her family member    Hoy Morn, MD 08/29/13 [phone]

## 2013-08-29 NOTE — ED Notes (Signed)
Pt states she feels like her heart is racing. Denies CP or SOB

## 2013-08-29 NOTE — ED Notes (Signed)
Family updated on labs that have resulted. Notified that we are waiting on D-Dimer results.

## 2013-09-16 ENCOUNTER — Encounter: Payer: Self-pay | Admitting: Cardiology

## 2013-09-16 ENCOUNTER — Ambulatory Visit (INDEPENDENT_AMBULATORY_CARE_PROVIDER_SITE_OTHER): Payer: Medicare PPO | Admitting: Cardiology

## 2013-09-16 VITALS — BP 180/80 | HR 118 | Ht 66.0 in | Wt 156.0 lb

## 2013-09-16 DIAGNOSIS — I498 Other specified cardiac arrhythmias: Secondary | ICD-10-CM

## 2013-09-16 DIAGNOSIS — I10 Essential (primary) hypertension: Secondary | ICD-10-CM

## 2013-09-16 DIAGNOSIS — R000 Tachycardia, unspecified: Secondary | ICD-10-CM

## 2013-09-16 DIAGNOSIS — R002 Palpitations: Secondary | ICD-10-CM

## 2013-09-16 LAB — TSH: TSH: 1.09 u[IU]/mL (ref 0.35–5.50)

## 2013-09-16 MED ORDER — METOPROLOL SUCCINATE ER 25 MG PO TB24: 25.0000 mg | ORAL_TABLET | Freq: Every day | ORAL | Status: DC

## 2013-09-16 NOTE — Progress Notes (Signed)
[place], [place], [place]  [address] Phone: [phone] Fax:  [phone]  Date:  09/16/2013   ID:  Robyn Mathews, DOB February 20, 1941, MRN [id number]  PCP:  Wenda Low, MD  Cardiologist:  Fransico Him ,MD  E   History of Present Illness: Robyn Mathews is a 73 y.o. female with a history of DM, HTN, dyslipidemia, GERD and CKD who presents today for evaluation of palpitations.  She was seen in the ER 08/29/2013 with complaints of palpitations but no chest pain or SOB.  It is intermittent but has not had any dizziness or syncope.  She has no history of arrhythmias in the past.  EKG in the ER showed sinus tachycardia at 130bpm initially and HR came down to 95bpm.  D-Dimer as negative.  She is now here for further evaluation.  She says that her palpitations are very sporadic and only last a few seconds and resolve.  She denies any LE edema.  She says that she gets very nervous and then her HR goes up.  She drinks 2 cups of coffee in the am daily but no soda.   Wt Readings from Last 3 Encounters:  09/16/13 156 lb (70.761 kg)  08/29/13 156 lb 4.8 oz (70.897 kg)  05/07/13 160 lb (72.576 kg)     Past Medical History  Diagnosis Date  . Diabetes mellitus without complication   . Pulmonary emboli     5'07  . Heart palpitations     occ., not a problem  . Hypertension   . Chronic kidney disease     stage 3 kidney disease-being followed Dr. Deforest Hoyles  . GERD (gastroesophageal reflux disease)   . Arthritis     knees, toes  . Cancer     muscle right leg-surgery  . Hyperlipidemia     statin intolerant  . Fibromyalgia     Current Outpatient Prescriptions  Medication Sig Dispense Refill  . acetaminophen (TYLENOL) 325 MG tablet Take 650 mg by mouth every 6 (six) hours as needed for pain.      Marland Kitchen allopurinol (ZYLOPRIM) 100 MG tablet Take 200 mg by mouth daily.      Marland Kitchen amLODipine (NORVASC) 5 MG tablet Take 5 mg by mouth daily.      Marland Kitchen aspirin 81 MG tablet Take 81 mg by mouth daily.       . hydrochlorothiazide (HYDRODIURIL) 25 MG tablet Take 12.5 mg by mouth daily.      . metFORMIN (GLUCOPHAGE) 1000 MG tablet Take 500 mg by mouth 2 (two) times daily with a meal.      . ramipril (ALTACE) 10 MG capsule Take 10 mg by mouth daily.       No current facility-administered medications for this visit.    Allergies:    Allergies  Allergen Reactions  . Amoxicillin Other (See Comments)    Unknown-"skin curls up"    Social History:  The patient  reports that she quit smoking about 39 years ago. Her smoking use included Cigarettes. She smoked 0.00 packs per day for 10 years. She does not have any smokeless tobacco history on file. She reports that she does not drink alcohol or use illicit drugs.   Family History:  The patient's family history is not on file.   ROS:  Please see the history of present illness.      All other systems reviewed and negative.   PHYSICAL EXAM: VS:  BP 180/80  Pulse 118  Ht 5\' 6"  (  1.676 m)  Wt 156 lb (70.761 kg)  BMI 25.19 kg/m2 Well nourished, well developed, in no acute distress HEENT: normal Neck: no JVD Cardiac:  normal S1, S2; RRR; no murmurtachycardia Lungs:  clear to auscultation bilaterally, no wheezing, rhonchi or rales Abd: soft, nontender, no hepatomegaly Ext: no edema Skin: warm and dry Neuro:  CNs 2-12 intact, no focal abnormalities noted  EKG:  Sinus tachycardia at  131bpm with inferior infarct    ASSESSMENT AND PLAN:  1. Palpitations with sinus tachycardia on 12 lead EKG - check 24 hour Holter monitor to assess for average HR during a 24 hour period - check TSH - Lifewatch monitor to evaluate for other arrhythmias such as PAF 2.  HTN - poorly controlled - start Toprol XL 25mg  daily 3.  Sinus tachycardia of ? Etiology - ? Anxiety vs. Hyperthyroidism vs. LV dysfunction - check TSH - check 2D echo to assess LVF  Followup with my nurse on 4/2 for EKG check and with me in 4 weeks  Signed, Fransico Him, MD 09/16/2013 3:03 PM

## 2013-09-16 NOTE — Patient Instructions (Addendum)
Your physician has recommended you make the following change in your medication: 1. Start Toprol XL 25 MG 1 tablet daily  Your physician recommends that you go to the lab today for a TSH  Your physician has requested that you have an echocardiogram. Echocardiography is a painless test that uses sound waves to create images of your heart. It provides your doctor with information about the size and shape of your heart and how well your heart's chambers and valves are working. This procedure takes approximately one hour. There are no restrictions for this procedure.  Your physician has recommended that you wear a holter monitor. Holter monitors are medical devices that record the heart's electrical activity. Doctors most often use these monitors to diagnose arrhythmias. Arrhythmias are problems with the speed or rhythm of the heartbeat. The monitor is a small, portable device. You can wear one while you do your normal daily activities. This is usually used to diagnose what is causing palpitations/syncope (passing out). ( Lifetch monitor that goes from 24 hour to 30 day monitor.)  Your physician has recommended that you wear an event monitor. Event monitors are medical devices that record the heart's electrical activity. Doctors most often [place] these monitors to diagnose arrhythmias. Arrhythmias are problems with the speed or rhythm of the heartbeat. The monitor is a small, portable device. You can wear one while you do your normal daily activities. This is usually used to diagnose what is causing palpitations/syncope (passing out). ( Lifetch monitor that goes from 24 hour to 30 day monitor.)  Your physician recommends that you schedule a follow-up appointment on Thursday April 2nd for a nurse visit and EKG. This has to be fit in per Dr Radford Pax  Your physician recommends that you schedule a follow-up appointment in: 4 weeks with Dr Radford Pax

## 2013-09-18 ENCOUNTER — Other Ambulatory Visit (HOSPITAL_COMMUNITY): Payer: Medicare PPO

## 2013-09-19 ENCOUNTER — Encounter (INDEPENDENT_AMBULATORY_CARE_PROVIDER_SITE_OTHER): Payer: Medicare PPO

## 2013-09-19 ENCOUNTER — Ambulatory Visit (HOSPITAL_COMMUNITY): Payer: Medicare PPO | Attending: Cardiovascular Disease | Admitting: Radiology

## 2013-09-19 ENCOUNTER — Ambulatory Visit (INDEPENDENT_AMBULATORY_CARE_PROVIDER_SITE_OTHER): Payer: Medicare PPO | Admitting: *Deleted

## 2013-09-19 ENCOUNTER — Encounter: Payer: Self-pay | Admitting: Cardiovascular Disease

## 2013-09-19 ENCOUNTER — Encounter: Payer: Self-pay | Admitting: *Deleted

## 2013-09-19 VITALS — BP 148/78 | HR 84 | Wt 156.1 lb

## 2013-09-19 DIAGNOSIS — I498 Other specified cardiac arrhythmias: Secondary | ICD-10-CM

## 2013-09-19 DIAGNOSIS — R002 Palpitations: Secondary | ICD-10-CM

## 2013-09-19 DIAGNOSIS — R000 Tachycardia, unspecified: Secondary | ICD-10-CM

## 2013-09-19 DIAGNOSIS — R9431 Abnormal electrocardiogram [ECG] [EKG]: Secondary | ICD-10-CM

## 2013-09-19 NOTE — Progress Notes (Signed)
Patient ID: Robyn Mathews, female   DOB: 1940/09/27, 73 y.o.   MRN: [id number] 24 hour E-Cardio holter monitor applied to patient.

## 2013-09-19 NOTE — Progress Notes (Signed)
1.) Reason for visit: EKG  2.) Name of MD requesting visit: Dr. Fransico Him  3.) H&P: History of DM,HTN,Dyslipidemia,Gerd,CKD, palpitations with sinus tachycardia  4.) ROS related to problem:  No c/o.  Feeling a little tired  5.) Assessment and plan per MD: Per last OV started on Toprol XL 25 mg daily, lifewatch monitor placed, echo today, f/u with Dr. Radford Pax 4/23 at 11:15.  EKG done today showing NSR with HR 84.. Reviewed by DOD-Dr. Lovena Le  6.) Provider sign-of(MD statement): Agree with above. GT

## 2013-09-19 NOTE — Progress Notes (Signed)
Echocardiogram performed.  

## 2013-09-20 ENCOUNTER — Encounter: Payer: Self-pay | Admitting: Radiology

## 2013-09-20 ENCOUNTER — Encounter (INDEPENDENT_AMBULATORY_CARE_PROVIDER_SITE_OTHER): Payer: Medicare PPO

## 2013-09-20 DIAGNOSIS — I498 Other specified cardiac arrhythmias: Secondary | ICD-10-CM

## 2013-09-20 DIAGNOSIS — R002 Palpitations: Secondary | ICD-10-CM

## 2013-09-20 NOTE — Progress Notes (Signed)
Patient ID: Robyn Mathews, female   DOB: 16-Mar-1941, 73 y.o.   MRN: [id number] E cardio 30 day monitor applied

## 2013-09-23 ENCOUNTER — Telehealth: Payer: Self-pay | Admitting: Cardiology

## 2013-09-23 NOTE — Telephone Encounter (Signed)
New message ° ° ° ° ° °Returning a nurses call °

## 2013-09-23 NOTE — Telephone Encounter (Signed)
Returned call to patient she stated someone already called her with echo results.

## 2013-09-24 ENCOUNTER — Telehealth: Payer: Self-pay | Admitting: Cardiology

## 2013-09-24 NOTE — Telephone Encounter (Signed)
Please let patient know that heart monitor showed NSR with occasional extra heart beats from the bottom of the heart which are bening

## 2013-09-24 NOTE — Telephone Encounter (Signed)
Pt.notified

## 2013-10-10 ENCOUNTER — Encounter: Payer: Self-pay | Admitting: Cardiology

## 2013-10-10 ENCOUNTER — Ambulatory Visit (INDEPENDENT_AMBULATORY_CARE_PROVIDER_SITE_OTHER): Payer: Medicare PPO | Admitting: Cardiology

## 2013-10-10 VITALS — BP 187/84 | HR 107 | Ht 66.0 in | Wt 160.0 lb

## 2013-10-10 DIAGNOSIS — I10 Essential (primary) hypertension: Secondary | ICD-10-CM

## 2013-10-10 DIAGNOSIS — R002 Palpitations: Secondary | ICD-10-CM

## 2013-10-10 MED ORDER — METOPROLOL SUCCINATE ER 50 MG PO TB24: 50.0000 mg | ORAL_TABLET | Freq: Every day | ORAL | Status: DC

## 2013-10-10 NOTE — Progress Notes (Signed)
[place], [address] [place], [place]  [address] Phone: [phone] Fax:  [phone]  Date:  10/10/2013   ID:  HAILYN ZARR, DOB [birth year], MRN [id number]  PCP:  Wenda Low, MD  Cardiologist:  Fransico Him, MD     History of Present Illness: MELICIA ESQUEDA is a 73 y.o. female with a history of DM, HTN, dyslipidemia, GERD and CKD who presents today for followup of palpitations. She was seen in the ER 08/29/2013 with complaints of palpitations but no chest pain or SOB. It is intermittent but has not had any dizziness or syncope. She has no history of arrhythmias in the past. EKG in the ER showed sinus tachycardia at 130bpm initially and HR came down to 95bpm. D-Dimer as negative. She says that she gets very nervous and then her HR goes up. She drinks 2 cups of coffee in the am daily but no soda.  When I saw her a few weeks ago I added Toprol for better BP control.  She now presents back today for followup.  Her event monitor showed NSR with occasional PVC's.  2D echo showed normal LVF with LVH.  She does use table salt.     Wt Readings from Last 3 Encounters:  10/10/13 160 lb (72.576 kg)  09/19/13 156 lb 1.9 oz (70.816 kg)  09/16/13 156 lb (70.761 kg)     Past Medical History  Diagnosis Date  . Diabetes mellitus without complication   . Pulmonary emboli     5'07  . Heart palpitations     occ., not a problem  . Chronic kidney disease     stage 3 kidney disease-being followed Dr. Deforest Hoyles  . GERD (gastroesophageal reflux disease)   . Arthritis     knees, toes  . Cancer     muscle right leg-surgery  . Hyperlipidemia     statin intolerant  . Fibromyalgia   . Hypertension     Current Outpatient Prescriptions  Medication Sig Dispense Refill  . acetaminophen (TYLENOL) 325 MG tablet Take 650 mg by mouth every 6 (six) hours as needed for pain.      Marland Kitchen allopurinol (ZYLOPRIM) 100 MG tablet Take 200 mg by mouth daily.      Marland Kitchen amLODipine (NORVASC) 5 MG tablet Take 5 mg by mouth  daily.      Marland Kitchen aspirin 81 MG tablet Take 81 mg by mouth daily.      . hydrochlorothiazide (HYDRODIURIL) 25 MG tablet Take 12.5 mg by mouth daily.      . metFORMIN (GLUCOPHAGE) 1000 MG tablet Take 500 mg by mouth 2 (two) times daily with a meal.      . metoprolol succinate (TOPROL-XL) 25 MG 24 hr tablet Take 1 tablet (25 mg total) by mouth daily. Take with or immediately following a meal.  30 tablet  6  . ramipril (ALTACE) 10 MG capsule Take 10 mg by mouth daily.       No current facility-administered medications for this visit.    Allergies:    Allergies  Allergen Reactions  . Amoxicillin Other (See Comments)    Unknown-"skin curls up"    Social History:  The patient  reports that she quit smoking about 39 years ago. Her smoking use included Cigarettes. She smoked 0.00 packs per day for 10 years. She does not have any smokeless tobacco history on file. She reports that she does not drink alcohol or use illicit drugs.   Family History:  The patient's family  history includes CVA in her sister; Diabetes in her sister; Hypertension in her mother.   ROS:  Please see the history of present illness.      All other systems reviewed and negative.   PHYSICAL EXAM: VS:  BP 187/84  Pulse 107  Ht 5\' 6"  (1.676 m)  Wt 160 lb (72.576 kg)  BMI 25.84 kg/m2 Well nourished, well developed, in no acute distress HEENT: normal Neck: no JVD Cardiac:  normal S1, S2; RRR; no murmur Lungs:  clear to auscultation bilaterally, no wheezing, rhonchi or rales Abd: soft, nontender, no hepatomegaly Ext: no edema Skin: warm and dry Neuro:  CNs 2-12 intact, no focal abnormalities noted      ASSESSMENT AND PLAN:  1.  Palpitations with sinus tachycardia on 12 lead EKG.  TSH was normal.  Heart monitor showed NSR with occasional PVC's.  Average HR on Holter 82bpm.  Echo showed normal LVF 2. HTN - poorly controlled with evidence of end organ damage with LVH on echo.  She needs aggressive BP treatment.   - Increase  Toprol XL to 50mg  daily  - continue Ramipril/amlodipine/HCTZ - cut all added salt in diet - nurse check in 1 week for BP eval 3. Sinus tachycardia resolved on beta blockers  Followup with me 3 months  Signed, Fransico Him, MD 10/10/2013 10:59 AM

## 2013-10-10 NOTE — Patient Instructions (Signed)
Your physician has recommended you make the following change in your medication: 1. Increase Toprol to 50 MG 1 tablet daily ( A new Rx has been sent into your pharmacy for you)  Dr Radford Pax has asked that you cut out all added salt in your diet.  Your physician recommends that you schedule a follow-up appointment in: 1 week for a BP check with nurse  Your physician recommends that you schedule a follow-up appointment in: 3 months with Dr Radford Pax

## 2013-10-17 ENCOUNTER — Encounter: Payer: Self-pay | Admitting: *Deleted

## 2013-10-17 ENCOUNTER — Ambulatory Visit (INDEPENDENT_AMBULATORY_CARE_PROVIDER_SITE_OTHER): Payer: Medicare PPO | Admitting: *Deleted

## 2013-10-17 VITALS — BP 146/82 | HR 90 | Ht 60.0 in | Wt 161.6 lb

## 2013-10-17 DIAGNOSIS — I10 Essential (primary) hypertension: Secondary | ICD-10-CM

## 2013-10-17 NOTE — Progress Notes (Signed)
1.) Reason for visit: blood pressure check  2.) Name of MD requesting visit: Dr. Radford Pax  3.) H&P: hypertension  4.) ROS related to problem: asymptomatic  5.) Assessment and plan per MD: Dr. Rayann Heman (DOD) reviewed. No changes made  6.) Provider sign-of(MD statement): BP adequately controlled - continue current meds

## 2013-10-28 ENCOUNTER — Telehealth: Payer: Self-pay | Admitting: Cardiology

## 2013-10-28 NOTE — Telephone Encounter (Signed)
New Message:  Pt is requesting a call back to hear her monitor results

## 2013-10-28 NOTE — Telephone Encounter (Signed)
Pt is aware.  

## 2013-10-28 NOTE — Telephone Encounter (Signed)
Spoke with pt and made aware we have not received final result for event monitor that was ordered for pt. Will call her once I get those from Dr Radford Pax.

## 2013-10-28 NOTE — Telephone Encounter (Signed)
Please let patient know that heart monitor showed NSR with occasional PVC's which are benign 

## 2014-01-15 ENCOUNTER — Ambulatory Visit: Payer: Medicare PPO | Admitting: Cardiology

## 2014-01-27 ENCOUNTER — Ambulatory Visit (INDEPENDENT_AMBULATORY_CARE_PROVIDER_SITE_OTHER): Payer: Medicare PPO | Admitting: Cardiology

## 2014-01-27 ENCOUNTER — Encounter: Payer: Self-pay | Admitting: Cardiology

## 2014-01-27 ENCOUNTER — Other Ambulatory Visit: Payer: Self-pay

## 2014-01-27 VITALS — BP 142/84 | HR 78 | Ht 59.0 in | Wt 162.0 lb

## 2014-01-27 DIAGNOSIS — R002 Palpitations: Secondary | ICD-10-CM

## 2014-01-27 DIAGNOSIS — Z1231 Encounter for screening mammogram for malignant neoplasm of breast: Secondary | ICD-10-CM

## 2014-01-27 DIAGNOSIS — I498 Other specified cardiac arrhythmias: Secondary | ICD-10-CM

## 2014-01-27 DIAGNOSIS — R000 Tachycardia, unspecified: Secondary | ICD-10-CM

## 2014-01-27 DIAGNOSIS — I10 Essential (primary) hypertension: Secondary | ICD-10-CM

## 2014-01-27 NOTE — Patient Instructions (Signed)
Your physician recommends that you continue on your current medications as directed. Please refer to the Current Medication list given to you today.  Your physician wants you to follow-up in: 6 months with Dr Turner You will receive a reminder letter in the mail two months in advance. If you don't receive a letter, please call our office to schedule the follow-up appointment.  

## 2014-01-27 NOTE — Progress Notes (Signed)
[place], [place], [place]  [address] Phone: [phone] Fax:  [phone]  Date:  01/27/2014   ID:  Robyn Mathews, DOB December 14, 1940, MRN [id number]  PCP:  Wenda Low, MD  Cardiologist:  Fransico Him, MD     History of Present Illness: Robyn Mathews is a 73 y.o. female with a history of DM, HTN, dyslipidemia, GERD and CKD who presents today for followup of palpitations. She was seen in the ER 08/29/2013 with complaints of palpitations but no chest pain or SOB. It is intermittent but has not had any dizziness or syncope. She has no history of arrhythmias in the past. EKG in the ER showed sinus tachycardia at 130bpm initially and HR came down to 95bpm. D-Dimer as negative. She says that she gets very nervous and then her HR goes up. She drinks 2 cups of coffee in the am daily but no soda. When I saw her last  I added Toprol for better BP control. She now presents back today for followup. Her event monitor showed NSR with occasional PVC's. 2D echo showed normal LVF with LVH. She does use table salt. She denies any chest pain, SOB, DOE, LE edema, dizziness, palpitations or syncope.    Wt Readings from Last 3 Encounters:  01/27/14 162 lb (73.483 kg)  10/17/13 161 lb 9.6 oz (73.301 kg)  10/10/13 160 lb (72.576 kg)     Past Medical History  Diagnosis Date  . Diabetes mellitus without complication   . Pulmonary emboli     5'07  . Heart palpitations     occ., not a problem  . Chronic kidney disease     stage 3 kidney disease-being followed Dr. Deforest Hoyles  . GERD (gastroesophageal reflux disease)   . Arthritis     knees, toes  . Cancer     muscle right leg-surgery  . Hyperlipidemia     statin intolerant  . Fibromyalgia   . Hypertension     Current Outpatient Prescriptions  Medication Sig Dispense Refill  . acetaminophen (TYLENOL) 325 MG tablet Take 650 mg by mouth every 6 (six) hours as needed for pain.      Marland Kitchen allopurinol (ZYLOPRIM) 100 MG tablet Take 200 mg by mouth  daily.      Marland Kitchen amLODipine (NORVASC) 5 MG tablet Take 5 mg by mouth daily.      Marland Kitchen aspirin 81 MG tablet Take 81 mg by mouth daily.      Marland Kitchen FREESTYLE LITE test strip       . hydrochlorothiazide (HYDRODIURIL) 25 MG tablet Take 12.5 mg by mouth daily.      . metFORMIN (GLUCOPHAGE) 1000 MG tablet Take 500 mg by mouth 2 (two) times daily with a meal.      . metoprolol succinate (TOPROL-XL) 50 MG 24 hr tablet Take 1 tablet (50 mg total) by mouth daily. Take with or immediately following a meal.  30 tablet  6  . ramipril (ALTACE) 10 MG capsule Take 10 mg by mouth daily.       No current facility-administered medications for this visit.    Allergies:    Allergies  Allergen Reactions  . Amoxicillin Other (See Comments)    Unknown-"skin curls up"    Social History:  The patient  reports that she quit smoking about 39 years ago. Her smoking use included Cigarettes. She smoked 0.00 packs per day for 10 years. She does not have any smokeless tobacco history on file. She reports that she  does not drink alcohol or use illicit drugs.   Family History:  The patient's family history includes CVA in her sister; Diabetes in her sister; Hypertension in her mother.   ROS:  Please see the history of present illness.      All other systems reviewed and negative.   PHYSICAL EXAM: VS:  BP 142/84  Pulse 78  Ht 4\' 11"  (1.499 m)  Wt 162 lb (73.483 kg)  BMI 32.70 kg/m2 Well nourished, well developed, in no acute distress HEENT: normal Neck: no JVD Cardiac:  normal S1, S2; RRR; no murmur Lungs:  clear to auscultation bilaterally, no wheezing, rhonchi or rales Abd: soft, nontender, no hepatomegaly Ext: no edema Skin: warm and dry Neuro:  CNs 2-12 intact, no focal abnormalities noted  ASSESSMENT AND PLAN:  1. Palpitations -TSH was normal. Heart monitor showed NSR with occasional PVC's. Average HR on Holter 82bpm. Echo showed normal LVF.  Palpitations have resolved on BB. 2. HTN -  controlled - continue  Ramipril/amlodipine/HCTZ/BB - She has a PE with her PCP later this month and she will have her PCP fax a copy of her BMET 3. Sinus tachycardia resolved on beta blockers   Followup with me 6 months       Signed, Fransico Him, MD 01/27/2014 3:24 PM

## 2014-02-07 ENCOUNTER — Encounter: Payer: Self-pay | Admitting: Cardiology

## 2014-02-17 ENCOUNTER — Ambulatory Visit
Admission: RE | Admit: 2014-02-17 | Discharge: 2014-02-17 | Disposition: A | Discharge status: Home / Self Care | Payer: Medicare PPO | Source: Ambulatory Visit

## 2014-02-17 DIAGNOSIS — Z1231 Encounter for screening mammogram for malignant neoplasm of breast: Secondary | ICD-10-CM

## 2014-04-27 ENCOUNTER — Other Ambulatory Visit: Payer: Self-pay | Admitting: Cardiology

## 2014-05-29 ENCOUNTER — Encounter: Payer: Self-pay | Admitting: Cardiology

## 2014-08-08 ENCOUNTER — Encounter (HOSPITAL_COMMUNITY): Payer: Self-pay | Admitting: *Deleted

## 2014-08-08 ENCOUNTER — Telehealth: Payer: Self-pay | Admitting: Cardiology

## 2014-08-08 ENCOUNTER — Emergency Department (HOSPITAL_COMMUNITY)
Admission: EM | Admit: 2014-08-08 | Discharge: 2014-08-08 | Disposition: A | Discharge status: Home / Self Care | Payer: Medicare PPO | Attending: Emergency Medicine | Admitting: Emergency Medicine

## 2014-08-08 DIAGNOSIS — I129 Hypertensive chronic kidney disease with stage 1 through stage 4 chronic kidney disease, or unspecified chronic kidney disease: Secondary | ICD-10-CM | POA: Insufficient documentation

## 2014-08-08 DIAGNOSIS — Z87891 Personal history of nicotine dependence: Secondary | ICD-10-CM | POA: Insufficient documentation

## 2014-08-08 DIAGNOSIS — Z86711 Personal history of pulmonary embolism: Secondary | ICD-10-CM | POA: Diagnosis not present

## 2014-08-08 DIAGNOSIS — Z859 Personal history of malignant neoplasm, unspecified: Secondary | ICD-10-CM | POA: Diagnosis not present

## 2014-08-08 DIAGNOSIS — N189 Chronic kidney disease, unspecified: Secondary | ICD-10-CM | POA: Diagnosis not present

## 2014-08-08 DIAGNOSIS — Z79899 Other long term (current) drug therapy: Secondary | ICD-10-CM | POA: Insufficient documentation

## 2014-08-08 DIAGNOSIS — I10 Essential (primary) hypertension: Secondary | ICD-10-CM

## 2014-08-08 DIAGNOSIS — M179 Osteoarthritis of knee, unspecified: Secondary | ICD-10-CM | POA: Insufficient documentation

## 2014-08-08 DIAGNOSIS — E119 Type 2 diabetes mellitus without complications: Secondary | ICD-10-CM | POA: Diagnosis not present

## 2014-08-08 DIAGNOSIS — Z880 Allergy status to penicillin: Secondary | ICD-10-CM | POA: Diagnosis not present

## 2014-08-08 DIAGNOSIS — Z7982 Long term (current) use of aspirin: Secondary | ICD-10-CM | POA: Insufficient documentation

## 2014-08-08 DIAGNOSIS — Z8719 Personal history of other diseases of the digestive system: Secondary | ICD-10-CM | POA: Diagnosis not present

## 2014-08-08 LAB — CBC WITH DIFFERENTIAL/PLATELET
Basophils Absolute: 0.1 10*3/uL (ref 0.0–0.1)
Basophils Relative: 1 % (ref 0–1)
EOS ABS: 0.1 10*3/uL (ref 0.0–0.7)
EOS PCT: 2 % (ref 0–5)
HCT: 38 % (ref 36.0–46.0)
Hemoglobin: 12.9 g/dL (ref 12.0–15.0)
LYMPHS ABS: 1.6 10*3/uL (ref 0.7–4.0)
LYMPHS PCT: 19 % (ref 12–46)
MCH: 30.4 pg (ref 26.0–34.0)
MCHC: 33.9 g/dL (ref 30.0–36.0)
MCV: 89.4 fL (ref 78.0–100.0)
Monocytes Absolute: 0.4 10*3/uL (ref 0.1–1.0)
Monocytes Relative: 5 % (ref 3–12)
NEUTROS PCT: 73 % (ref 43–77)
Neutro Abs: 5.9 10*3/uL (ref 1.7–7.7)
Platelets: 234 10*3/uL (ref 150–400)
RBC: 4.25 MIL/uL (ref 3.87–5.11)
RDW: 13.6 % (ref 11.5–15.5)
WBC: 8 10*3/uL (ref 4.0–10.5)

## 2014-08-08 LAB — BASIC METABOLIC PANEL
Anion gap: 11 (ref 5–15)
BUN: 17 mg/dL (ref 6–23)
CALCIUM: 10.3 mg/dL (ref 8.4–10.5)
CO2: 26 mmol/L (ref 19–32)
Chloride: 98 mmol/L (ref 96–112)
Creatinine, Ser: 1.07 mg/dL (ref 0.50–1.10)
GFR calc Af Amer: 59 mL/min — ABNORMAL LOW (ref >90)
GFR calc non Af Amer: 51 mL/min — ABNORMAL LOW (ref >90)
Glucose, Bld: 112 mg/dL — ABNORMAL HIGH (ref 70–99)
POTASSIUM: 3.8 mmol/L (ref 3.5–5.1)
SODIUM: 135 mmol/L (ref 135–145)

## 2014-08-08 NOTE — ED Provider Notes (Signed)
CSN: [id number]     Arrival date & time 08/08/14  1710 History   First MD Initiated Contact with Patient 08/08/14 2102     Chief Complaint  Patient presents with  . Hypertension     (Consider location/radiation/quality/duration/timing/severity/associated sxs/prior Treatment) Patient is a 74 y.o. female presenting with hypertension.  Hypertension This is a new problem. The current episode started less than 1 hour ago. The problem occurs constantly. The problem has not changed since onset.Pertinent negatives include no chest pain, no abdominal pain, no headaches and no shortness of breath. Nothing aggravates the symptoms. Nothing relieves the symptoms. She has tried nothing for the symptoms.    Past Medical History  Diagnosis Date  . Diabetes mellitus without complication   . Pulmonary emboli     5'07  . Heart palpitations     occ., not a problem  . Chronic kidney disease     stage 3 kidney disease-being followed Dr. Deforest Hoyles  . GERD (gastroesophageal reflux disease)   . Arthritis     knees, toes  . Cancer     muscle right leg-surgery  . Hyperlipidemia     statin intolerant  . Fibromyalgia   . Hypertension    Past Surgical History  Procedure Laterality Date  . Bladder tack    . Leg surgery Right     8'03-growth removed from muscle(cancer)-surgery only  . Abdominal hysterectomy      '13  . Hand surgery Right     growth removed little finger  . Colonoscopy with propofol N/A 05/07/2013    Procedure: COLONOSCOPY WITH PROPOFOL;  Surgeon: Garlan Fair, MD;  Location: [facility];  Service: Endoscopy;  Laterality: N/A;   Family History  Problem Relation Age of Onset  . Hypertension Mother   . CVA Sister   . Diabetes Sister    History  Substance Use Topics  . Smoking status: Former Smoker -- 10 years    Types: Cigarettes    Quit date: 04/19/1974  . Smokeless tobacco: Not on file  . Alcohol Use: No   OB History    No data available     Review of Systems   Respiratory: Negative for shortness of breath.   Cardiovascular: Negative for chest pain.  Gastrointestinal: Negative for abdominal pain.  Neurological: Negative for headaches.  All other systems reviewed and are negative.     Allergies  Amoxicillin  Home Medications   Prior to Admission medications   Medication Sig Start Date End Date Taking? Authorizing Provider  acetaminophen (TYLENOL) 325 MG tablet Take 650 mg by mouth every 6 (six) hours as needed for pain.   Yes Historical Provider, MD  allopurinol (ZYLOPRIM) 100 MG tablet Take 200 mg by mouth daily.   Yes Historical Provider, MD  amLODipine (NORVASC) 5 MG tablet Take 5 mg by mouth daily.   Yes Historical Provider, MD  aspirin 81 MG tablet Take 81 mg by mouth daily.   Yes Historical Provider, MD  hydrochlorothiazide (HYDRODIURIL) 25 MG tablet Take 12.5 mg by mouth daily.   Yes Historical Provider, MD  metFORMIN (GLUCOPHAGE) 1000 MG tablet Take 500 mg by mouth 2 (two) times daily with a meal.   Yes Historical Provider, MD  metoprolol succinate (TOPROL-XL) 50 MG 24 hr tablet TAKE 1 TABLET (50 MG TOTAL) BY MOUTH DAILY. TAKE WITH OR IMMEDIATELY FOLLOWING A MEAL. 04/28/14  Yes Sueanne Margarita, MD  ramipril (ALTACE) 10 MG capsule Take 10 mg by mouth daily.   Yes Historical Provider,  MD   BP 151/72 mmHg  Pulse 82  Temp(Src) 98.1 F (36.7 C) (Oral)  Resp 16  SpO2 99% Physical Exam  Constitutional: She is oriented to person, place, and time. She appears well-developed and well-nourished.  HENT:  Head: Normocephalic and atraumatic.  Right Ear: External ear normal.  Left Ear: External ear normal.  Eyes: Conjunctivae and EOM are normal. Pupils are equal, round, and reactive to light.  Neck: Normal range of motion. Neck supple.  Cardiovascular: Normal rate, regular rhythm, normal heart sounds and intact distal pulses.   Pulmonary/Chest: Effort normal and breath sounds normal.  Abdominal: Soft. Bowel sounds are normal. There is no  tenderness.  Musculoskeletal: Normal range of motion.  Neurological: She is alert and oriented to person, place, and time.  Skin: Skin is warm and dry.  Vitals reviewed.   ED Course  Procedures (including critical care time) Labs Review Labs Reviewed  BASIC METABOLIC PANEL - Abnormal; Notable for the following:    Glucose, Bld 112 (*)    GFR calc non Af Amer 51 (*)    GFR calc Af Amer 59 (*)    All other components within normal limits  CBC WITH DIFFERENTIAL/PLATELET  URINALYSIS, ROUTINE W REFLEX MICROSCOPIC    Imaging Review No results found.   EKG Interpretation   Date/Time:  Friday August 08 2014 21:10:36 EST Ventricular Rate:  110 PR Interval:  144 QRS Duration: 79 QT Interval:  329 QTC Calculation: 445 R Axis:   -7 Text Interpretation:  Sinus tachycardia Prominent P waves, nondiagnostic  No significant change since last tracing Confirmed by Debby Freiberg  [phone]) on 08/08/2014 9:33:18 PM      MDM   Final diagnoses:  Essential hypertension    74 y.o. female with pertinent PMH of HTN, DM, ckd presents with asymptomatic HTN to 485I systolic.  Physical exam benign on arrival.  Elba Barman as above unremarkable.  DC home in stable condition, bp improved without intervention.    I have reviewed all laboratory and imaging studies if ordered as above  1. Essential hypertension         Debby Freiberg, MD 08/08/14 [phone]

## 2014-08-08 NOTE — Telephone Encounter (Signed)
New message     Pt c/o BP issue: STAT if pt c/o blurred vision, one-sided weakness or slurred speech  1. What are your last 5 BP readings?  219/108 at 1:30  2. Are you having any other symptoms (ex. Dizziness, headache, blurred vision, passed out)? no  3. What is your BP issue?  NP from her ins co came out and checked her bp and did an assement.  Her bp was high.  Should she take another pill?

## 2014-08-08 NOTE — ED Notes (Signed)
The pts bp is usually well-controlled  Until today when her bp has been high

## 2014-08-08 NOTE — Telephone Encounter (Signed)
Patient rechecked BP. 224/116. Per Dr. Radford Pax, instructed patient to go to the ER. Patient agrees with treatment plan.

## 2014-08-08 NOTE — Discharge Instructions (Signed)

## 2014-08-14 DIAGNOSIS — I493 Ventricular premature depolarization: Secondary | ICD-10-CM | POA: Insufficient documentation

## 2014-08-14 NOTE — Progress Notes (Signed)
Cardiology Office Note   Date:  08/15/2014   ID:  Robyn Mathews, DOB April 15, 1941, MRN [id number]  PCP:  Wenda Low, MD  Cardiologist:   Sueanne Margarita, MD   Chief Complaint  Patient presents with  . Palpitations  . Hypertension  . Tachycardia      History of Present Illness: Robyn Mathews is a 74 y.o. female with a history of DM, HTN, dyslipidemia, GERD, palpitations with PVC's on monitor and CKD who presents today for followup. She was recently in the ER a few days ago with HTN with SBP as high as 264mmHg with no other symptoms.  Apparently a person for her health insurance came out to take her BP and she felt uncomfortable with him and she got stressed out and her BP went up to 200's.   In the ER her BP was 151/56mmHg and HR 110bpm.  She was discharged home later that day.   She denies any chest pain, SOB, DOE, LE edema, dizziness, palpitations or syncope.  She says that normally her BP runs normal at home at 130/42mmHg.      Past Medical History  Diagnosis Date  . Diabetes mellitus without complication   . Pulmonary emboli     5'07  . Heart palpitations     occ., not a problem  . Chronic kidney disease     stage 3 kidney disease-being followed Dr. Deforest Hoyles  . GERD (gastroesophageal reflux disease)   . Arthritis     knees, toes  . Cancer     muscle right leg-surgery  . Hyperlipidemia     statin intolerant  . Fibromyalgia   . Hypertension     Past Surgical History  Procedure Laterality Date  . Bladder tack    . Leg surgery Right     8'03-growth removed from muscle(cancer)-surgery only  . Abdominal hysterectomy      '13  . Hand surgery Right     growth removed little finger  . Colonoscopy with propofol N/A 05/07/2013    Procedure: COLONOSCOPY WITH PROPOFOL;  Surgeon: Garlan Fair, MD;  Location: [facility];  Service: Endoscopy;  Laterality: N/A;     Current Outpatient Prescriptions  Medication Sig Dispense Refill  . acetaminophen (TYLENOL) 325 MG  tablet Take 650 mg by mouth every 6 (six) hours as needed for pain.    Marland Kitchen allopurinol (ZYLOPRIM) 100 MG tablet Take 200 mg by mouth daily.    Marland Kitchen ALPRAZolam (XANAX) 0.5 MG tablet Take 0.5 mg by mouth as needed. For anxiety    . amLODipine (NORVASC) 5 MG tablet Take 5 mg by mouth daily.    Marland Kitchen aspirin 81 MG tablet Take 81 mg by mouth daily.    . hydrochlorothiazide (HYDRODIURIL) 25 MG tablet Take 25 mg by mouth daily.     . metFORMIN (GLUCOPHAGE) 1000 MG tablet Take 500 mg by mouth 2 (two) times daily with a meal.    . metoprolol succinate (TOPROL-XL) 50 MG 24 hr tablet TAKE 1 TABLET (50 MG TOTAL) BY MOUTH DAILY. TAKE WITH OR IMMEDIATELY FOLLOWING A MEAL. 30 tablet 3  . ramipril (ALTACE) 10 MG capsule Take 10 mg by mouth daily.     No current facility-administered medications for this visit.    Allergies:   Amoxicillin    Social History:  The patient  reports that she quit smoking about 40 years ago. Her smoking use included Cigarettes. She quit after 10 years of use. She does not have any smokeless  tobacco history on file. She reports that she does not drink alcohol or use illicit drugs.   Family History:  The patient's family history includes CVA in her sister; Diabetes in her sister; Hypertension in her mother.    ROS:  Please see the history of present illness.   Otherwise, review of systems are positive for none.   All other systems are reviewed and negative.    PHYSICAL EXAM: VS:  BP 168/78 mmHg  Pulse 114  Ht 5' (1.524 m)  Wt 161 lb 12.8 oz (73.392 kg)  BMI 31.60 kg/m2  SpO2 97% , BMI Body mass index is 31.6 kg/(m^2). GEN: Well nourished, well developed, in no acute distress HEENT: normal Neck: no JVD, carotid bruits, or masses Cardiac: RRR; no murmurs, rubs, or gallops,no edema  Respiratory:  clear to auscultation bilaterally, normal work of breathing GI: soft, nontender, nondistended, + BS MS: no deformity or atrophy Skin: warm and dry, no rash Neuro:  Strength and sensation  are intact Psych: euthymic mood, full affect   EKG:  EKG is not ordered today.    Recent Labs: 09/16/2013: TSH 1.09 08/08/2014: BUN 17; Creatinine 1.07; Hemoglobin 12.9; Platelets 234; Potassium 3.8; Sodium 135    Lipid Panel No results found for: CHOL, TRIG, HDL, CHOLHDL, VLDL, LDLCALC, LDLDIRECT    Wt Readings from Last 3 Encounters:  08/15/14 161 lb 12.8 oz (73.392 kg)  01/27/14 162 lb (73.483 kg)  10/17/13 161 lb 9.6 oz (73.301 kg)       ASSESSMENT AND PLAN:  1. Palpitations -TSH was normal. Heart monitor showed NSR with occasional PVC's. Average HR on Holter 82bpm. Echo showed normal LVF. Palpitations have resolved on BB. 2. HTN - BP elevated today but says she is very nervous. I have asked her to check her BP and HR daily for a week 2 hours after taking her meds and call with the results. - continue Ramipril/amlodipine/HCTZ/BB 3. Sinus tachycardia resolved on beta blockers   Current medicines are reviewed at length with the patient today.  The patient does not have concerns regarding medicines.  The following changes have been made:  no change  Labs/ tests ordered today include: None    Disposition:   FU with me in 6 months   Signed, Sueanne Margarita, MD  08/15/2014 8:45 AM    [facility] [address], [place], [place]  [address] Phone: [phone]; Fax: [phone]

## 2014-08-15 ENCOUNTER — Encounter: Payer: Self-pay | Admitting: Cardiology

## 2014-08-15 ENCOUNTER — Ambulatory Visit (INDEPENDENT_AMBULATORY_CARE_PROVIDER_SITE_OTHER): Payer: Medicare PPO | Admitting: Cardiology

## 2014-08-15 VITALS — BP 168/78 | HR 114 | Ht 60.0 in | Wt 161.8 lb

## 2014-08-15 DIAGNOSIS — R000 Tachycardia, unspecified: Secondary | ICD-10-CM

## 2014-08-15 DIAGNOSIS — I493 Ventricular premature depolarization: Secondary | ICD-10-CM

## 2014-08-15 DIAGNOSIS — R002 Palpitations: Secondary | ICD-10-CM

## 2014-08-15 DIAGNOSIS — I471 Supraventricular tachycardia: Secondary | ICD-10-CM

## 2014-08-15 DIAGNOSIS — I10 Essential (primary) hypertension: Secondary | ICD-10-CM

## 2014-08-15 NOTE — Patient Instructions (Signed)
Please check your blood pressure and heart rate daily for one week (ideally 2 hours after you take your medicines) and call us with the results.  Your physician wants you to follow-up in: 6 months with Dr. Radford Pax. You will receive a reminder letter in the mail two months in advance. If you don't receive a letter, please call our office to schedule the follow-up appointment.

## 2014-08-21 ENCOUNTER — Other Ambulatory Visit: Payer: Self-pay | Admitting: Cardiology

## 2014-08-22 NOTE — Telephone Encounter (Signed)
Patient called with recent BP log: 2/27: BP 155/75 HR 84 2/28: BP 146/54 HR 80 2/29: BP 139/72 HR 72 3/1:   BP 135/57 HR 79 3/2:   BP 136/72 HR 72          BP 130/69 HR 76 (later that day) 3/3:   BP 127/61 HR 76          BP 132/61 HR 70 3/4:   BP 129/67 HR 105  To Dr. Radford Pax for review.

## 2014-08-22 NOTE — Telephone Encounter (Signed)
F/U       Pt calling in regards to discuss BP with the nurse.    Please return call.

## 2014-10-21 DIAGNOSIS — H2513 Age-related nuclear cataract, bilateral: Secondary | ICD-10-CM | POA: Diagnosis not present

## 2014-10-21 DIAGNOSIS — E119 Type 2 diabetes mellitus without complications: Secondary | ICD-10-CM | POA: Diagnosis not present

## 2014-11-20 ENCOUNTER — Encounter: Payer: Self-pay | Admitting: Cardiology

## 2014-12-20 ENCOUNTER — Other Ambulatory Visit: Payer: Self-pay | Admitting: Cardiology

## 2014-12-23 ENCOUNTER — Other Ambulatory Visit: Payer: Self-pay | Admitting: Cardiology

## 2015-01-30 ENCOUNTER — Other Ambulatory Visit: Payer: Self-pay

## 2015-01-30 DIAGNOSIS — Z1231 Encounter for screening mammogram for malignant neoplasm of breast: Secondary | ICD-10-CM

## 2015-02-10 DIAGNOSIS — Z0000 Encounter for general adult medical examination without abnormal findings: Secondary | ICD-10-CM | POA: Diagnosis not present

## 2015-02-10 DIAGNOSIS — F419 Anxiety disorder, unspecified: Secondary | ICD-10-CM | POA: Diagnosis not present

## 2015-02-10 DIAGNOSIS — M109 Gout, unspecified: Secondary | ICD-10-CM | POA: Diagnosis not present

## 2015-02-10 DIAGNOSIS — E782 Mixed hyperlipidemia: Secondary | ICD-10-CM | POA: Diagnosis not present

## 2015-02-10 DIAGNOSIS — M8588 Other specified disorders of bone density and structure, other site: Secondary | ICD-10-CM | POA: Diagnosis not present

## 2015-02-10 DIAGNOSIS — N183 Chronic kidney disease, stage 3 (moderate): Secondary | ICD-10-CM | POA: Diagnosis not present

## 2015-02-10 DIAGNOSIS — E1122 Type 2 diabetes mellitus with diabetic chronic kidney disease: Secondary | ICD-10-CM | POA: Diagnosis not present

## 2015-02-10 DIAGNOSIS — C499 Malignant neoplasm of connective and soft tissue, unspecified: Secondary | ICD-10-CM | POA: Diagnosis not present

## 2015-02-10 DIAGNOSIS — I10 Essential (primary) hypertension: Secondary | ICD-10-CM | POA: Diagnosis not present

## 2015-02-12 ENCOUNTER — Encounter: Payer: Self-pay | Admitting: Cardiology

## 2015-02-12 ENCOUNTER — Ambulatory Visit (INDEPENDENT_AMBULATORY_CARE_PROVIDER_SITE_OTHER): Payer: Medicare PPO | Admitting: Cardiology

## 2015-02-12 VITALS — BP 158/84 | HR 84 | Ht <= 58 in | Wt 159.8 lb

## 2015-02-12 DIAGNOSIS — I493 Ventricular premature depolarization: Secondary | ICD-10-CM | POA: Diagnosis not present

## 2015-02-12 DIAGNOSIS — I10 Essential (primary) hypertension: Secondary | ICD-10-CM

## 2015-02-12 DIAGNOSIS — I471 Supraventricular tachycardia: Secondary | ICD-10-CM | POA: Diagnosis not present

## 2015-02-12 DIAGNOSIS — R000 Tachycardia, unspecified: Secondary | ICD-10-CM

## 2015-02-12 NOTE — Patient Instructions (Signed)

## 2015-02-12 NOTE — Progress Notes (Signed)
Cardiology Office Note   Date:  02/12/2015   ID:  Robyn Mathews, DOB [birth year], MRN [id number]  PCP:  Wenda Low, MD    Chief Complaint  Patient presents with  . Follow-up    pvc      History of Present Illness: Robyn Mathews is a 74 y.o. female with a history of DM, HTN, dyslipidemia, GERD, palpitations with PVC's on monitor and CKD who presents today for followup.  She denies any chest pain, SOB, DOE, LE edema, dizziness, palpitations or syncope.      Past Medical History  Diagnosis Date  . Diabetes mellitus without complication   . Pulmonary emboli     5'07  . Heart palpitations     occ., not a problem  . Chronic kidney disease     stage 3 kidney disease-being followed Dr. Deforest Hoyles  . GERD (gastroesophageal reflux disease)   . Arthritis     knees, toes  . Cancer     muscle right leg-surgery  . Hyperlipidemia     statin intolerant  . Fibromyalgia   . Hypertension     Past Surgical History  Procedure Laterality Date  . Bladder tack    . Leg surgery Right     8'03-growth removed from muscle(cancer)-surgery only  . Abdominal hysterectomy      '13  . Hand surgery Right     growth removed little finger  . Colonoscopy with propofol N/A 05/07/2013    Procedure: COLONOSCOPY WITH PROPOFOL;  Surgeon: Garlan Fair, MD;  Location: [facility];  Service: Endoscopy;  Laterality: N/A;     Current Outpatient Prescriptions  Medication Sig Dispense Refill  . acetaminophen (TYLENOL) 325 MG tablet Take 650 mg by mouth every 6 (six) hours as needed for pain.    Marland Kitchen allopurinol (ZYLOPRIM) 100 MG tablet Take 200 mg by mouth daily.    Marland Kitchen ALPRAZolam (XANAX) 0.5 MG tablet Take 0.5 mg by mouth as needed. For anxiety    . amLODipine (NORVASC) 5 MG tablet Take 5 mg by mouth daily.    Marland Kitchen aspirin 81 MG tablet Take 81 mg by mouth daily.    Marland Kitchen glimepiride (AMARYL) 2 MG tablet Take 2 mg by mouth as needed.     . hydrochlorothiazide (HYDRODIURIL) 25 MG tablet  Take 25 mg by mouth daily.     . metFORMIN (GLUCOPHAGE) 1000 MG tablet Take 500 mg by mouth 2 (two) times daily with a meal.    . metoprolol succinate (TOPROL-XL) 50 MG 24 hr tablet TAKE 1 TABLET BY MOUTH DAILY. TAKE WITH OR IMMEDIATELY FOLLOWING A MEAL 30 tablet 1  . ramipril (ALTACE) 10 MG capsule Take 10 mg by mouth daily.     No current facility-administered medications for this visit.    Allergies:   Amoxicillin    Social History:  The patient  reports that she quit smoking about 40 years ago. Her smoking use included Cigarettes. She quit after 10 years of use. She does not have any smokeless tobacco history on file. She reports that she does not drink alcohol or use illicit drugs.   Family History:  The patient's family history includes CVA in her sister; Diabetes in her sister; Hypertension in her mother.    ROS:  Please see the history of present illness.   Otherwise, review of systems are positive for none.   All other systems are reviewed and  negative.    PHYSICAL EXAM: VS:  BP 158/84 mmHg  Pulse 84  Ht 4\' 9"  (1.448 m)  Wt 159 lb 12.8 oz (72.485 kg)  BMI 34.57 kg/m2 , BMI Body mass index is 34.57 kg/(m^2). GEN: Well nourished, well developed, in no acute distress HEENT: normal Neck: no JVD, carotid bruits, or masses Cardiac: RRR; no murmurs, rubs, or gallops,no edema  Respiratory:  clear to auscultation bilaterally, normal work of breathing GI: soft, nontender, nondistended, + BS MS: no deformity or atrophy Skin: warm and dry, no rash Neuro:  Strength and sensation are intact Psych: euthymic mood, full affect   EKG:  EKG is not ordered today.    Recent Labs: 08/08/2014: BUN 17; Creatinine, Ser 1.07; Hemoglobin 12.9; Platelets 234; Potassium 3.8; Sodium 135    Lipid Panel No results found for: CHOL, TRIG, HDL, CHOLHDL, VLDL, LDLCALC, LDLDIRECT    Wt Readings from Last 3 Encounters:  02/12/15 159 lb 12.8 oz (72.485 kg)  08/15/14 161 lb 12.8 oz (73.392 kg)    01/27/14 162 lb (73.483 kg)    ASSESSMENT AND PLAN:  1. Palpitations - Heart monitor showed NSR with occasional PVC's. Palpitations have resolved on BB. 2. HTN - borderline controlled - at home it runs 135/49mmHg - continue Ramipril/amlodipine/HCTZ/BB 3. Sinus tachycardia resolved on beta blockers    Current medicines are reviewed at length with the patient today.  The patient does not have concerns regarding medicines.  The following changes have been made:  no change  Labs/ tests ordered today: See above Assessment and Plan No orders of the defined types were placed in this encounter.     Disposition:   FU with me in 1 year  Signed, Sueanne Margarita, MD  02/12/2015 2:04 PM    [address] [facility] [address], [place], [place]  [address] Phone: [phone]; Fax: [phone]

## 2015-02-20 ENCOUNTER — Ambulatory Visit
Admission: RE | Admit: 2015-02-20 | Discharge: 2015-02-20 | Disposition: A | Discharge status: Home / Self Care | Payer: Medicare PPO | Source: Ambulatory Visit

## 2015-02-20 DIAGNOSIS — Z1231 Encounter for screening mammogram for malignant neoplasm of breast: Secondary | ICD-10-CM

## 2015-03-10 DIAGNOSIS — M859 Disorder of bone density and structure, unspecified: Secondary | ICD-10-CM | POA: Diagnosis not present

## 2015-03-10 DIAGNOSIS — M8589 Other specified disorders of bone density and structure, multiple sites: Secondary | ICD-10-CM | POA: Diagnosis not present

## 2016-01-26 ENCOUNTER — Other Ambulatory Visit: Payer: Self-pay | Admitting: Internal Medicine

## 2016-01-26 DIAGNOSIS — Z1231 Encounter for screening mammogram for malignant neoplasm of breast: Secondary | ICD-10-CM

## 2016-02-23 ENCOUNTER — Ambulatory Visit
Admission: RE | Admit: 2016-02-23 | Discharge: 2016-02-23 | Disposition: A | Discharge status: Home / Self Care | Payer: Medicare Other | Source: Ambulatory Visit | Attending: Internal Medicine | Admitting: Internal Medicine

## 2016-02-23 DIAGNOSIS — Z1231 Encounter for screening mammogram for malignant neoplasm of breast: Secondary | ICD-10-CM

## 2016-10-17 ENCOUNTER — Ambulatory Visit
Admission: RE | Admit: 2016-10-17 | Discharge: 2016-10-17 | Disposition: A | Discharge status: Home / Self Care | Payer: Medicare Other | Source: Ambulatory Visit | Attending: Internal Medicine | Admitting: Internal Medicine

## 2016-10-17 ENCOUNTER — Other Ambulatory Visit: Payer: Self-pay | Admitting: Internal Medicine

## 2016-10-17 DIAGNOSIS — M25562 Pain in left knee: Secondary | ICD-10-CM

## 2017-01-24 ENCOUNTER — Other Ambulatory Visit: Payer: Self-pay | Admitting: Internal Medicine

## 2017-01-24 DIAGNOSIS — Z1231 Encounter for screening mammogram for malignant neoplasm of breast: Secondary | ICD-10-CM

## 2017-02-23 ENCOUNTER — Ambulatory Visit
Admission: RE | Admit: 2017-02-23 | Discharge: 2017-02-23 | Disposition: A | Discharge status: Home / Self Care | Payer: Medicare Other | Source: Ambulatory Visit | Attending: Internal Medicine | Admitting: Internal Medicine

## 2017-02-23 DIAGNOSIS — Z1231 Encounter for screening mammogram for malignant neoplasm of breast: Secondary | ICD-10-CM

## 2017-03-23 ENCOUNTER — Other Ambulatory Visit: Payer: Self-pay | Admitting: Internal Medicine

## 2017-03-23 ENCOUNTER — Ambulatory Visit
Admission: RE | Admit: 2017-03-23 | Discharge: 2017-03-23 | Disposition: A | Discharge status: Home / Self Care | Payer: Medicare Other | Source: Ambulatory Visit | Attending: Internal Medicine | Admitting: Internal Medicine

## 2017-03-23 DIAGNOSIS — R1011 Right upper quadrant pain: Secondary | ICD-10-CM

## 2018-01-22 ENCOUNTER — Other Ambulatory Visit: Payer: Self-pay | Admitting: Internal Medicine

## 2018-01-22 DIAGNOSIS — Z1231 Encounter for screening mammogram for malignant neoplasm of breast: Secondary | ICD-10-CM

## 2018-02-26 ENCOUNTER — Ambulatory Visit
Admission: RE | Admit: 2018-02-26 | Discharge: 2018-02-26 | Disposition: A | Discharge status: Home / Self Care | Payer: Medicare Other | Source: Ambulatory Visit | Attending: Internal Medicine | Admitting: Internal Medicine

## 2018-02-26 DIAGNOSIS — Z1231 Encounter for screening mammogram for malignant neoplasm of breast: Secondary | ICD-10-CM

## 2019-01-24 ENCOUNTER — Other Ambulatory Visit: Payer: Self-pay | Admitting: Internal Medicine

## 2019-01-24 DIAGNOSIS — Z1231 Encounter for screening mammogram for malignant neoplasm of breast: Secondary | ICD-10-CM

## 2019-03-07 ENCOUNTER — Ambulatory Visit
Admission: RE | Admit: 2019-03-07 | Discharge: 2019-03-07 | Disposition: A | Discharge status: Home / Self Care | Payer: Medicare Other | Source: Ambulatory Visit | Attending: Internal Medicine | Admitting: Internal Medicine

## 2019-03-07 ENCOUNTER — Other Ambulatory Visit: Payer: Self-pay

## 2019-03-07 DIAGNOSIS — Z1231 Encounter for screening mammogram for malignant neoplasm of breast: Secondary | ICD-10-CM

## 2019-09-05 DIAGNOSIS — K219 Gastro-esophageal reflux disease without esophagitis: Secondary | ICD-10-CM | POA: Diagnosis not present

## 2019-09-05 DIAGNOSIS — Z87891 Personal history of nicotine dependence: Secondary | ICD-10-CM | POA: Diagnosis not present

## 2019-09-05 DIAGNOSIS — Z7982 Long term (current) use of aspirin: Secondary | ICD-10-CM | POA: Diagnosis not present

## 2019-09-05 DIAGNOSIS — Z833 Family history of diabetes mellitus: Secondary | ICD-10-CM | POA: Diagnosis not present

## 2019-09-05 DIAGNOSIS — E669 Obesity, unspecified: Secondary | ICD-10-CM | POA: Diagnosis not present

## 2019-09-05 DIAGNOSIS — Z8249 Family history of ischemic heart disease and other diseases of the circulatory system: Secondary | ICD-10-CM | POA: Diagnosis not present

## 2019-09-05 DIAGNOSIS — Z6832 Body mass index (BMI) 32.0-32.9, adult: Secondary | ICD-10-CM | POA: Diagnosis not present

## 2019-09-05 DIAGNOSIS — R32 Unspecified urinary incontinence: Secondary | ICD-10-CM | POA: Diagnosis not present

## 2019-09-05 DIAGNOSIS — I10 Essential (primary) hypertension: Secondary | ICD-10-CM | POA: Diagnosis not present

## 2019-09-05 DIAGNOSIS — E1165 Type 2 diabetes mellitus with hyperglycemia: Secondary | ICD-10-CM | POA: Diagnosis not present

## 2019-09-05 DIAGNOSIS — M1990 Unspecified osteoarthritis, unspecified site: Secondary | ICD-10-CM | POA: Diagnosis not present

## 2019-09-05 DIAGNOSIS — M109 Gout, unspecified: Secondary | ICD-10-CM | POA: Diagnosis not present

## 2019-09-11 DIAGNOSIS — C499 Malignant neoplasm of connective and soft tissue, unspecified: Secondary | ICD-10-CM | POA: Diagnosis not present

## 2019-09-11 DIAGNOSIS — E1122 Type 2 diabetes mellitus with diabetic chronic kidney disease: Secondary | ICD-10-CM | POA: Diagnosis not present

## 2019-09-11 DIAGNOSIS — M109 Gout, unspecified: Secondary | ICD-10-CM | POA: Diagnosis not present

## 2019-09-11 DIAGNOSIS — E119 Type 2 diabetes mellitus without complications: Secondary | ICD-10-CM | POA: Diagnosis not present

## 2019-09-11 DIAGNOSIS — K219 Gastro-esophageal reflux disease without esophagitis: Secondary | ICD-10-CM | POA: Diagnosis not present

## 2019-09-11 DIAGNOSIS — I10 Essential (primary) hypertension: Secondary | ICD-10-CM | POA: Diagnosis not present

## 2019-09-11 DIAGNOSIS — N1831 Chronic kidney disease, stage 3a: Secondary | ICD-10-CM | POA: Diagnosis not present

## 2020-02-12 ENCOUNTER — Other Ambulatory Visit: Payer: Self-pay | Admitting: Internal Medicine

## 2020-02-12 DIAGNOSIS — Z1231 Encounter for screening mammogram for malignant neoplasm of breast: Secondary | ICD-10-CM

## 2020-03-09 ENCOUNTER — Ambulatory Visit
Admission: RE | Admit: 2020-03-09 | Discharge: 2020-03-09 | Disposition: A | Discharge status: Home / Self Care | Payer: Medicare PPO | Source: Ambulatory Visit | Attending: Internal Medicine | Admitting: Internal Medicine

## 2020-03-09 ENCOUNTER — Other Ambulatory Visit: Payer: Self-pay

## 2020-03-09 DIAGNOSIS — Z1231 Encounter for screening mammogram for malignant neoplasm of breast: Secondary | ICD-10-CM | POA: Diagnosis not present

## 2020-03-16 DIAGNOSIS — E1122 Type 2 diabetes mellitus with diabetic chronic kidney disease: Secondary | ICD-10-CM | POA: Diagnosis not present

## 2020-03-16 DIAGNOSIS — E782 Mixed hyperlipidemia: Secondary | ICD-10-CM | POA: Diagnosis not present

## 2020-03-16 DIAGNOSIS — Z0000 Encounter for general adult medical examination without abnormal findings: Secondary | ICD-10-CM | POA: Diagnosis not present

## 2020-03-16 DIAGNOSIS — I10 Essential (primary) hypertension: Secondary | ICD-10-CM | POA: Diagnosis not present

## 2020-03-16 DIAGNOSIS — M8580 Other specified disorders of bone density and structure, unspecified site: Secondary | ICD-10-CM | POA: Diagnosis not present

## 2020-03-16 DIAGNOSIS — M1990 Unspecified osteoarthritis, unspecified site: Secondary | ICD-10-CM | POA: Diagnosis not present

## 2020-03-16 DIAGNOSIS — Z1389 Encounter for screening for other disorder: Secondary | ICD-10-CM | POA: Diagnosis not present

## 2020-03-16 DIAGNOSIS — M109 Gout, unspecified: Secondary | ICD-10-CM | POA: Diagnosis not present

## 2020-03-16 DIAGNOSIS — N1831 Chronic kidney disease, stage 3a: Secondary | ICD-10-CM | POA: Diagnosis not present

## 2020-03-16 DIAGNOSIS — K219 Gastro-esophageal reflux disease without esophagitis: Secondary | ICD-10-CM | POA: Diagnosis not present

## 2020-03-16 DIAGNOSIS — I493 Ventricular premature depolarization: Secondary | ICD-10-CM | POA: Diagnosis not present

## 2020-03-16 DIAGNOSIS — Z23 Encounter for immunization: Secondary | ICD-10-CM | POA: Diagnosis not present

## 2020-04-14 DIAGNOSIS — H25813 Combined forms of age-related cataract, bilateral: Secondary | ICD-10-CM | POA: Diagnosis not present

## 2020-04-14 DIAGNOSIS — E119 Type 2 diabetes mellitus without complications: Secondary | ICD-10-CM | POA: Diagnosis not present

## 2020-04-14 DIAGNOSIS — H52203 Unspecified astigmatism, bilateral: Secondary | ICD-10-CM | POA: Diagnosis not present

## 2020-04-14 DIAGNOSIS — H5203 Hypermetropia, bilateral: Secondary | ICD-10-CM | POA: Diagnosis not present

## 2020-04-14 DIAGNOSIS — H524 Presbyopia: Secondary | ICD-10-CM | POA: Diagnosis not present

## 2020-04-14 DIAGNOSIS — Z7984 Long term (current) use of oral hypoglycemic drugs: Secondary | ICD-10-CM | POA: Diagnosis not present

## 2020-05-13 DIAGNOSIS — H2512 Age-related nuclear cataract, left eye: Secondary | ICD-10-CM | POA: Diagnosis not present

## 2020-05-13 DIAGNOSIS — H25811 Combined forms of age-related cataract, right eye: Secondary | ICD-10-CM | POA: Diagnosis not present

## 2020-05-20 DIAGNOSIS — H2511 Age-related nuclear cataract, right eye: Secondary | ICD-10-CM | POA: Diagnosis not present

## 2020-09-14 DIAGNOSIS — Z789 Other specified health status: Secondary | ICD-10-CM | POA: Diagnosis not present

## 2020-09-14 DIAGNOSIS — M8580 Other specified disorders of bone density and structure, unspecified site: Secondary | ICD-10-CM | POA: Diagnosis not present

## 2020-09-14 DIAGNOSIS — E782 Mixed hyperlipidemia: Secondary | ICD-10-CM | POA: Diagnosis not present

## 2020-09-14 DIAGNOSIS — M5430 Sciatica, unspecified side: Secondary | ICD-10-CM | POA: Diagnosis not present

## 2020-09-14 DIAGNOSIS — M109 Gout, unspecified: Secondary | ICD-10-CM | POA: Diagnosis not present

## 2020-09-14 DIAGNOSIS — K219 Gastro-esophageal reflux disease without esophagitis: Secondary | ICD-10-CM | POA: Diagnosis not present

## 2020-09-14 DIAGNOSIS — I10 Essential (primary) hypertension: Secondary | ICD-10-CM | POA: Diagnosis not present

## 2020-09-14 DIAGNOSIS — N1831 Chronic kidney disease, stage 3a: Secondary | ICD-10-CM | POA: Diagnosis not present

## 2020-09-14 DIAGNOSIS — E1122 Type 2 diabetes mellitus with diabetic chronic kidney disease: Secondary | ICD-10-CM | POA: Diagnosis not present

## 2020-09-21 DIAGNOSIS — E119 Type 2 diabetes mellitus without complications: Secondary | ICD-10-CM | POA: Diagnosis not present

## 2020-09-21 DIAGNOSIS — Z7984 Long term (current) use of oral hypoglycemic drugs: Secondary | ICD-10-CM | POA: Diagnosis not present

## 2020-10-05 DIAGNOSIS — Z961 Presence of intraocular lens: Secondary | ICD-10-CM | POA: Diagnosis not present

## 2021-01-15 DIAGNOSIS — G8929 Other chronic pain: Secondary | ICD-10-CM | POA: Diagnosis not present

## 2021-01-15 DIAGNOSIS — M1990 Unspecified osteoarthritis, unspecified site: Secondary | ICD-10-CM | POA: Diagnosis not present

## 2021-01-15 DIAGNOSIS — M109 Gout, unspecified: Secondary | ICD-10-CM | POA: Diagnosis not present

## 2021-01-15 DIAGNOSIS — R32 Unspecified urinary incontinence: Secondary | ICD-10-CM | POA: Diagnosis not present

## 2021-01-15 DIAGNOSIS — Z6831 Body mass index (BMI) 31.0-31.9, adult: Secondary | ICD-10-CM | POA: Diagnosis not present

## 2021-01-15 DIAGNOSIS — K219 Gastro-esophageal reflux disease without esophagitis: Secondary | ICD-10-CM | POA: Diagnosis not present

## 2021-01-15 DIAGNOSIS — E1165 Type 2 diabetes mellitus with hyperglycemia: Secondary | ICD-10-CM | POA: Diagnosis not present

## 2021-01-15 DIAGNOSIS — I10 Essential (primary) hypertension: Secondary | ICD-10-CM | POA: Diagnosis not present

## 2021-01-15 DIAGNOSIS — E669 Obesity, unspecified: Secondary | ICD-10-CM | POA: Diagnosis not present

## 2021-02-09 ENCOUNTER — Other Ambulatory Visit: Payer: Self-pay | Admitting: Internal Medicine

## 2021-02-09 DIAGNOSIS — Z1231 Encounter for screening mammogram for malignant neoplasm of breast: Secondary | ICD-10-CM

## 2021-03-16 ENCOUNTER — Other Ambulatory Visit: Payer: Self-pay

## 2021-03-16 ENCOUNTER — Ambulatory Visit
Admission: RE | Admit: 2021-03-16 | Discharge: 2021-03-16 | Disposition: A | Discharge status: Home / Self Care | Payer: Medicare PPO | Source: Ambulatory Visit | Attending: Internal Medicine | Admitting: Internal Medicine

## 2021-03-16 DIAGNOSIS — Z1231 Encounter for screening mammogram for malignant neoplasm of breast: Secondary | ICD-10-CM

## 2021-03-19 DIAGNOSIS — I10 Essential (primary) hypertension: Secondary | ICD-10-CM | POA: Diagnosis not present

## 2021-03-19 DIAGNOSIS — Z23 Encounter for immunization: Secondary | ICD-10-CM | POA: Diagnosis not present

## 2021-03-19 DIAGNOSIS — Z0000 Encounter for general adult medical examination without abnormal findings: Secondary | ICD-10-CM | POA: Diagnosis not present

## 2021-03-19 DIAGNOSIS — E782 Mixed hyperlipidemia: Secondary | ICD-10-CM | POA: Diagnosis not present

## 2021-03-19 DIAGNOSIS — M109 Gout, unspecified: Secondary | ICD-10-CM | POA: Diagnosis not present

## 2021-03-19 DIAGNOSIS — E1122 Type 2 diabetes mellitus with diabetic chronic kidney disease: Secondary | ICD-10-CM | POA: Diagnosis not present

## 2021-03-19 DIAGNOSIS — Z1389 Encounter for screening for other disorder: Secondary | ICD-10-CM | POA: Diagnosis not present

## 2021-03-19 DIAGNOSIS — M1990 Unspecified osteoarthritis, unspecified site: Secondary | ICD-10-CM | POA: Diagnosis not present

## 2021-03-19 DIAGNOSIS — I493 Ventricular premature depolarization: Secondary | ICD-10-CM | POA: Diagnosis not present

## 2021-03-19 DIAGNOSIS — N1831 Chronic kidney disease, stage 3a: Secondary | ICD-10-CM | POA: Diagnosis not present

## 2021-03-19 DIAGNOSIS — F419 Anxiety disorder, unspecified: Secondary | ICD-10-CM | POA: Diagnosis not present

## 2021-03-19 DIAGNOSIS — M8580 Other specified disorders of bone density and structure, unspecified site: Secondary | ICD-10-CM | POA: Diagnosis not present

## 2021-03-22 ENCOUNTER — Other Ambulatory Visit: Payer: Self-pay | Admitting: Internal Medicine

## 2021-03-22 DIAGNOSIS — R928 Other abnormal and inconclusive findings on diagnostic imaging of breast: Secondary | ICD-10-CM

## 2021-03-25 ENCOUNTER — Ambulatory Visit
Admission: RE | Admit: 2021-03-25 | Discharge: 2021-03-25 | Disposition: A | Discharge status: Home / Self Care | Payer: Medicare PPO | Source: Ambulatory Visit | Attending: Internal Medicine | Admitting: Internal Medicine

## 2021-03-25 ENCOUNTER — Other Ambulatory Visit: Payer: Self-pay

## 2021-03-25 DIAGNOSIS — R928 Other abnormal and inconclusive findings on diagnostic imaging of breast: Secondary | ICD-10-CM | POA: Diagnosis not present

## 2021-03-25 DIAGNOSIS — R922 Inconclusive mammogram: Secondary | ICD-10-CM | POA: Diagnosis not present

## 2021-07-09 DIAGNOSIS — F419 Anxiety disorder, unspecified: Secondary | ICD-10-CM | POA: Diagnosis not present

## 2021-07-09 DIAGNOSIS — G8929 Other chronic pain: Secondary | ICD-10-CM | POA: Diagnosis not present

## 2021-07-09 DIAGNOSIS — E1165 Type 2 diabetes mellitus with hyperglycemia: Secondary | ICD-10-CM | POA: Diagnosis not present

## 2021-07-09 DIAGNOSIS — I129 Hypertensive chronic kidney disease with stage 1 through stage 4 chronic kidney disease, or unspecified chronic kidney disease: Secondary | ICD-10-CM | POA: Diagnosis not present

## 2021-07-09 DIAGNOSIS — K219 Gastro-esophageal reflux disease without esophagitis: Secondary | ICD-10-CM | POA: Diagnosis not present

## 2021-07-09 DIAGNOSIS — E669 Obesity, unspecified: Secondary | ICD-10-CM | POA: Diagnosis not present

## 2021-07-09 DIAGNOSIS — I471 Supraventricular tachycardia: Secondary | ICD-10-CM | POA: Diagnosis not present

## 2021-07-09 DIAGNOSIS — E1122 Type 2 diabetes mellitus with diabetic chronic kidney disease: Secondary | ICD-10-CM | POA: Diagnosis not present

## 2021-07-09 DIAGNOSIS — M109 Gout, unspecified: Secondary | ICD-10-CM | POA: Diagnosis not present

## 2021-09-16 DIAGNOSIS — N1831 Chronic kidney disease, stage 3a: Secondary | ICD-10-CM | POA: Diagnosis not present

## 2021-09-16 DIAGNOSIS — E1122 Type 2 diabetes mellitus with diabetic chronic kidney disease: Secondary | ICD-10-CM | POA: Diagnosis not present

## 2021-09-16 DIAGNOSIS — G720 Drug-induced myopathy: Secondary | ICD-10-CM | POA: Diagnosis not present

## 2021-09-16 DIAGNOSIS — M8588 Other specified disorders of bone density and structure, other site: Secondary | ICD-10-CM | POA: Diagnosis not present

## 2021-09-16 DIAGNOSIS — Z7984 Long term (current) use of oral hypoglycemic drugs: Secondary | ICD-10-CM | POA: Diagnosis not present

## 2021-09-16 DIAGNOSIS — E782 Mixed hyperlipidemia: Secondary | ICD-10-CM | POA: Diagnosis not present

## 2021-09-16 DIAGNOSIS — I10 Essential (primary) hypertension: Secondary | ICD-10-CM | POA: Diagnosis not present

## 2021-09-16 DIAGNOSIS — C499 Malignant neoplasm of connective and soft tissue, unspecified: Secondary | ICD-10-CM | POA: Diagnosis not present

## 2021-09-16 DIAGNOSIS — M109 Gout, unspecified: Secondary | ICD-10-CM | POA: Diagnosis not present

## 2021-10-11 DIAGNOSIS — E119 Type 2 diabetes mellitus without complications: Secondary | ICD-10-CM | POA: Diagnosis not present

## 2021-10-11 DIAGNOSIS — H524 Presbyopia: Secondary | ICD-10-CM | POA: Diagnosis not present

## 2021-10-11 DIAGNOSIS — Z961 Presence of intraocular lens: Secondary | ICD-10-CM | POA: Diagnosis not present

## 2021-10-11 DIAGNOSIS — H52203 Unspecified astigmatism, bilateral: Secondary | ICD-10-CM | POA: Diagnosis not present

## 2021-10-11 DIAGNOSIS — Z7984 Long term (current) use of oral hypoglycemic drugs: Secondary | ICD-10-CM | POA: Diagnosis not present

## 2022-02-23 ENCOUNTER — Other Ambulatory Visit: Payer: Self-pay | Admitting: Internal Medicine

## 2022-02-23 DIAGNOSIS — Z1231 Encounter for screening mammogram for malignant neoplasm of breast: Secondary | ICD-10-CM

## 2022-03-17 ENCOUNTER — Ambulatory Visit
Admission: RE | Admit: 2022-03-17 | Discharge: 2022-03-17 | Disposition: A | Discharge status: Home / Self Care | Payer: Medicare PPO | Source: Ambulatory Visit | Attending: Internal Medicine | Admitting: Internal Medicine

## 2022-03-17 DIAGNOSIS — Z1231 Encounter for screening mammogram for malignant neoplasm of breast: Secondary | ICD-10-CM

## 2022-03-21 ENCOUNTER — Other Ambulatory Visit: Payer: Self-pay | Admitting: Internal Medicine

## 2022-03-21 DIAGNOSIS — E1122 Type 2 diabetes mellitus with diabetic chronic kidney disease: Secondary | ICD-10-CM | POA: Diagnosis not present

## 2022-03-21 DIAGNOSIS — M109 Gout, unspecified: Secondary | ICD-10-CM | POA: Diagnosis not present

## 2022-03-21 DIAGNOSIS — E782 Mixed hyperlipidemia: Secondary | ICD-10-CM | POA: Diagnosis not present

## 2022-03-21 DIAGNOSIS — Z0000 Encounter for general adult medical examination without abnormal findings: Secondary | ICD-10-CM | POA: Diagnosis not present

## 2022-03-21 DIAGNOSIS — M8580 Other specified disorders of bone density and structure, unspecified site: Secondary | ICD-10-CM

## 2022-03-21 DIAGNOSIS — F419 Anxiety disorder, unspecified: Secondary | ICD-10-CM | POA: Diagnosis not present

## 2022-03-21 DIAGNOSIS — N1831 Chronic kidney disease, stage 3a: Secondary | ICD-10-CM | POA: Diagnosis not present

## 2022-03-21 DIAGNOSIS — Z1331 Encounter for screening for depression: Secondary | ICD-10-CM | POA: Diagnosis not present

## 2022-03-21 DIAGNOSIS — C499 Malignant neoplasm of connective and soft tissue, unspecified: Secondary | ICD-10-CM | POA: Diagnosis not present

## 2022-03-21 DIAGNOSIS — Z23 Encounter for immunization: Secondary | ICD-10-CM | POA: Diagnosis not present

## 2022-03-21 DIAGNOSIS — G720 Drug-induced myopathy: Secondary | ICD-10-CM | POA: Diagnosis not present

## 2022-03-21 DIAGNOSIS — I10 Essential (primary) hypertension: Secondary | ICD-10-CM | POA: Diagnosis not present

## 2022-03-22 ENCOUNTER — Ambulatory Visit
Admission: RE | Admit: 2022-03-22 | Discharge: 2022-03-22 | Disposition: A | Discharge status: Home / Self Care | Payer: Medicare PPO | Source: Ambulatory Visit | Attending: Internal Medicine | Admitting: Internal Medicine

## 2022-03-22 DIAGNOSIS — Z780 Asymptomatic menopausal state: Secondary | ICD-10-CM | POA: Diagnosis not present

## 2022-03-22 DIAGNOSIS — M85852 Other specified disorders of bone density and structure, left thigh: Secondary | ICD-10-CM | POA: Diagnosis not present

## 2022-03-22 DIAGNOSIS — M8580 Other specified disorders of bone density and structure, unspecified site: Secondary | ICD-10-CM

## 2022-09-22 DIAGNOSIS — E1122 Type 2 diabetes mellitus with diabetic chronic kidney disease: Secondary | ICD-10-CM | POA: Diagnosis not present

## 2022-09-22 DIAGNOSIS — N1831 Chronic kidney disease, stage 3a: Secondary | ICD-10-CM | POA: Diagnosis not present

## 2022-09-22 DIAGNOSIS — M8580 Other specified disorders of bone density and structure, unspecified site: Secondary | ICD-10-CM | POA: Diagnosis not present

## 2022-09-22 DIAGNOSIS — I10 Essential (primary) hypertension: Secondary | ICD-10-CM | POA: Diagnosis not present

## 2022-09-22 DIAGNOSIS — M109 Gout, unspecified: Secondary | ICD-10-CM | POA: Diagnosis not present

## 2022-09-22 DIAGNOSIS — E782 Mixed hyperlipidemia: Secondary | ICD-10-CM | POA: Diagnosis not present

## 2022-10-17 DIAGNOSIS — H5213 Myopia, bilateral: Secondary | ICD-10-CM | POA: Diagnosis not present

## 2022-10-17 DIAGNOSIS — Z961 Presence of intraocular lens: Secondary | ICD-10-CM | POA: Diagnosis not present

## 2022-10-17 DIAGNOSIS — H52203 Unspecified astigmatism, bilateral: Secondary | ICD-10-CM | POA: Diagnosis not present

## 2022-10-17 DIAGNOSIS — H524 Presbyopia: Secondary | ICD-10-CM | POA: Diagnosis not present

## 2022-10-17 DIAGNOSIS — Z7984 Long term (current) use of oral hypoglycemic drugs: Secondary | ICD-10-CM | POA: Diagnosis not present

## 2022-10-17 DIAGNOSIS — E119 Type 2 diabetes mellitus without complications: Secondary | ICD-10-CM | POA: Diagnosis not present

## 2022-10-21 DIAGNOSIS — I10 Essential (primary) hypertension: Secondary | ICD-10-CM | POA: Diagnosis not present

## 2022-11-09 DIAGNOSIS — I129 Hypertensive chronic kidney disease with stage 1 through stage 4 chronic kidney disease, or unspecified chronic kidney disease: Secondary | ICD-10-CM | POA: Diagnosis not present

## 2022-11-09 DIAGNOSIS — M8580 Other specified disorders of bone density and structure, unspecified site: Secondary | ICD-10-CM | POA: Diagnosis not present

## 2022-11-09 DIAGNOSIS — K219 Gastro-esophageal reflux disease without esophagitis: Secondary | ICD-10-CM | POA: Diagnosis not present

## 2022-11-09 DIAGNOSIS — I471 Supraventricular tachycardia, unspecified: Secondary | ICD-10-CM | POA: Diagnosis not present

## 2022-11-09 DIAGNOSIS — E669 Obesity, unspecified: Secondary | ICD-10-CM | POA: Diagnosis not present

## 2022-11-09 DIAGNOSIS — R32 Unspecified urinary incontinence: Secondary | ICD-10-CM | POA: Diagnosis not present

## 2022-11-09 DIAGNOSIS — N189 Chronic kidney disease, unspecified: Secondary | ICD-10-CM | POA: Diagnosis not present

## 2022-11-09 DIAGNOSIS — E785 Hyperlipidemia, unspecified: Secondary | ICD-10-CM | POA: Diagnosis not present

## 2022-11-09 DIAGNOSIS — M1990 Unspecified osteoarthritis, unspecified site: Secondary | ICD-10-CM | POA: Diagnosis not present

## 2022-11-21 DIAGNOSIS — I10 Essential (primary) hypertension: Secondary | ICD-10-CM | POA: Diagnosis not present

## 2022-12-08 DIAGNOSIS — M79674 Pain in right toe(s): Secondary | ICD-10-CM | POA: Diagnosis not present

## 2022-12-29 DIAGNOSIS — I10 Essential (primary) hypertension: Secondary | ICD-10-CM | POA: Diagnosis not present

## 2023-01-17 DIAGNOSIS — E782 Mixed hyperlipidemia: Secondary | ICD-10-CM | POA: Diagnosis not present

## 2023-01-17 DIAGNOSIS — I10 Essential (primary) hypertension: Secondary | ICD-10-CM | POA: Diagnosis not present

## 2023-02-13 ENCOUNTER — Other Ambulatory Visit: Payer: Self-pay | Admitting: Internal Medicine

## 2023-02-13 DIAGNOSIS — Z1231 Encounter for screening mammogram for malignant neoplasm of breast: Secondary | ICD-10-CM

## 2023-03-21 ENCOUNTER — Ambulatory Visit
Admission: RE | Admit: 2023-03-21 | Discharge: 2023-03-21 | Disposition: A | Discharge status: Home / Self Care | Payer: Medicare PPO | Source: Ambulatory Visit | Attending: Internal Medicine | Admitting: Internal Medicine

## 2023-03-21 DIAGNOSIS — Z1231 Encounter for screening mammogram for malignant neoplasm of breast: Secondary | ICD-10-CM | POA: Diagnosis not present

## 2023-04-11 DIAGNOSIS — I493 Ventricular premature depolarization: Secondary | ICD-10-CM | POA: Diagnosis not present

## 2023-04-11 DIAGNOSIS — Z0000 Encounter for general adult medical examination without abnormal findings: Secondary | ICD-10-CM | POA: Diagnosis not present

## 2023-04-11 DIAGNOSIS — M109 Gout, unspecified: Secondary | ICD-10-CM | POA: Diagnosis not present

## 2023-04-11 DIAGNOSIS — Z1331 Encounter for screening for depression: Secondary | ICD-10-CM | POA: Diagnosis not present

## 2023-04-11 DIAGNOSIS — I10 Essential (primary) hypertension: Secondary | ICD-10-CM | POA: Diagnosis not present

## 2023-04-11 DIAGNOSIS — N1831 Chronic kidney disease, stage 3a: Secondary | ICD-10-CM | POA: Diagnosis not present

## 2023-04-11 DIAGNOSIS — E782 Mixed hyperlipidemia: Secondary | ICD-10-CM | POA: Diagnosis not present

## 2023-04-11 DIAGNOSIS — Z23 Encounter for immunization: Secondary | ICD-10-CM | POA: Diagnosis not present

## 2023-04-11 DIAGNOSIS — M8580 Other specified disorders of bone density and structure, unspecified site: Secondary | ICD-10-CM | POA: Diagnosis not present

## 2023-04-11 DIAGNOSIS — C499 Malignant neoplasm of connective and soft tissue, unspecified: Secondary | ICD-10-CM | POA: Diagnosis not present

## 2023-04-11 DIAGNOSIS — E1122 Type 2 diabetes mellitus with diabetic chronic kidney disease: Secondary | ICD-10-CM | POA: Diagnosis not present

## 2023-04-28 IMAGING — MG MM DIGITAL SCREENING BILAT W/ TOMO AND CAD
8 series · 8 of 24 positions shown · non-contrast
Comparison: Previous exam(s).

CLINICAL DATA: Screening.

EXAM:
DIGITAL SCREENING BILATERAL MAMMOGRAM WITH TOMOSYNTHESIS AND CAD
TECHNIQUE: Bilateral screening digital craniocaudal and mediolateral oblique
mammograms were obtained. Bilateral screening digital breast
tomosynthesis was performed. The images were evaluated with
computer-aided detection.

[R MLO synth-2D]
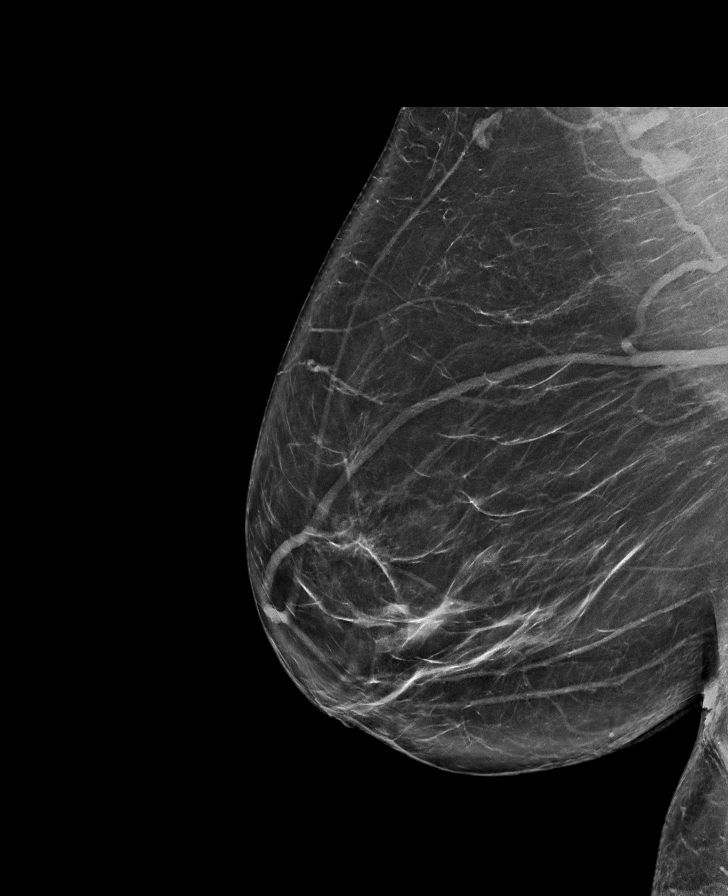

[R CC synth-2D]
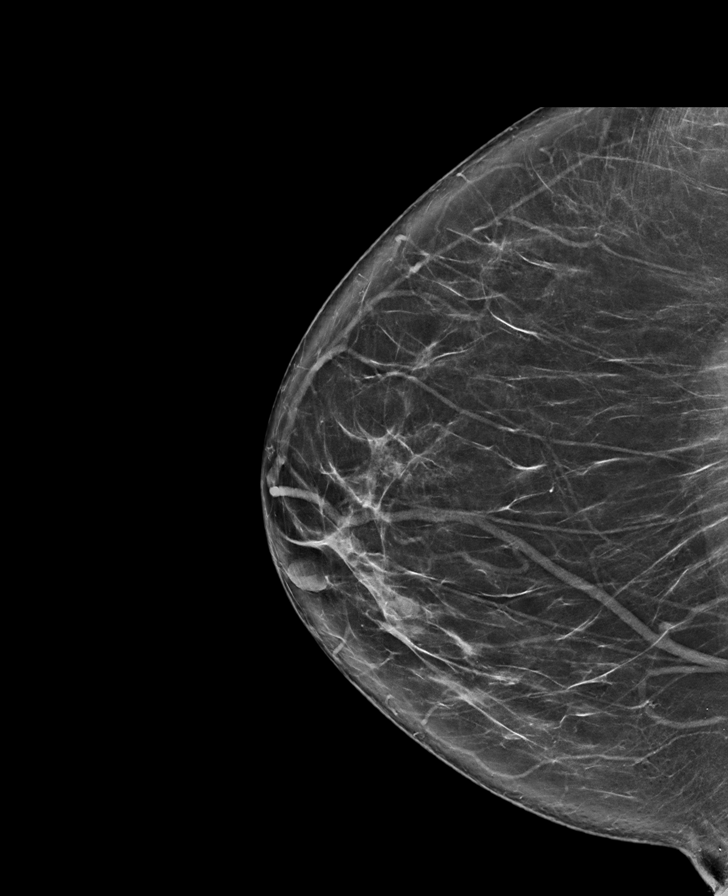

[L MLO synth-2D]
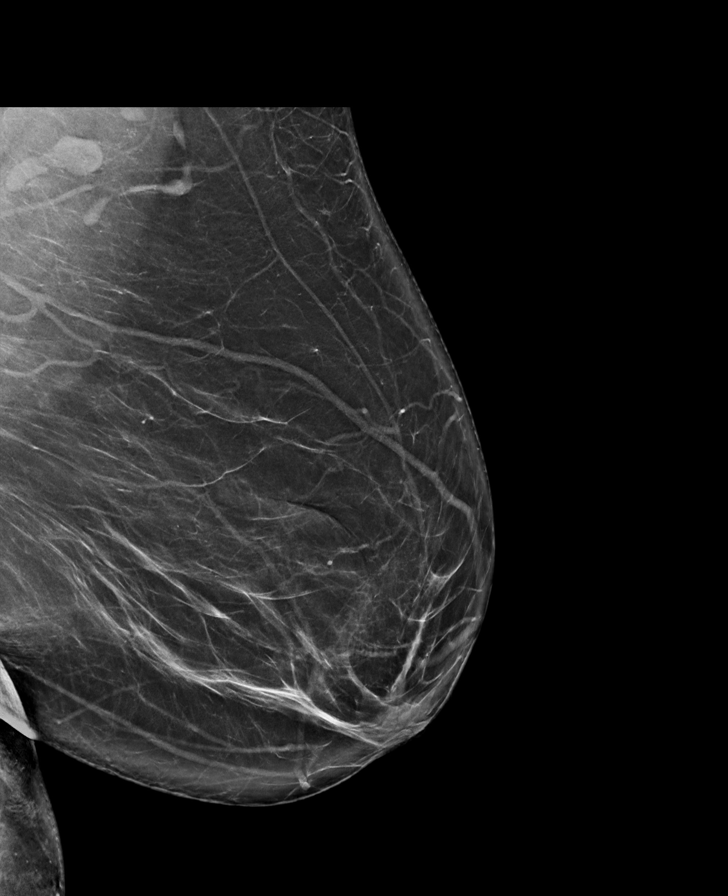

[L CC synth-2D]
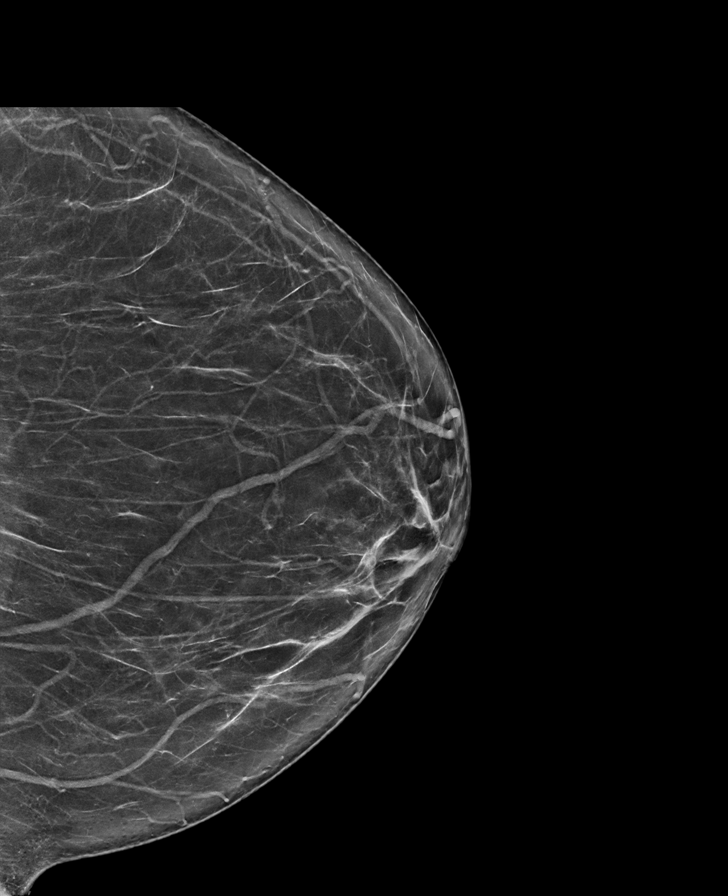

[R MLO tomo · tomo slice 45/88.0]
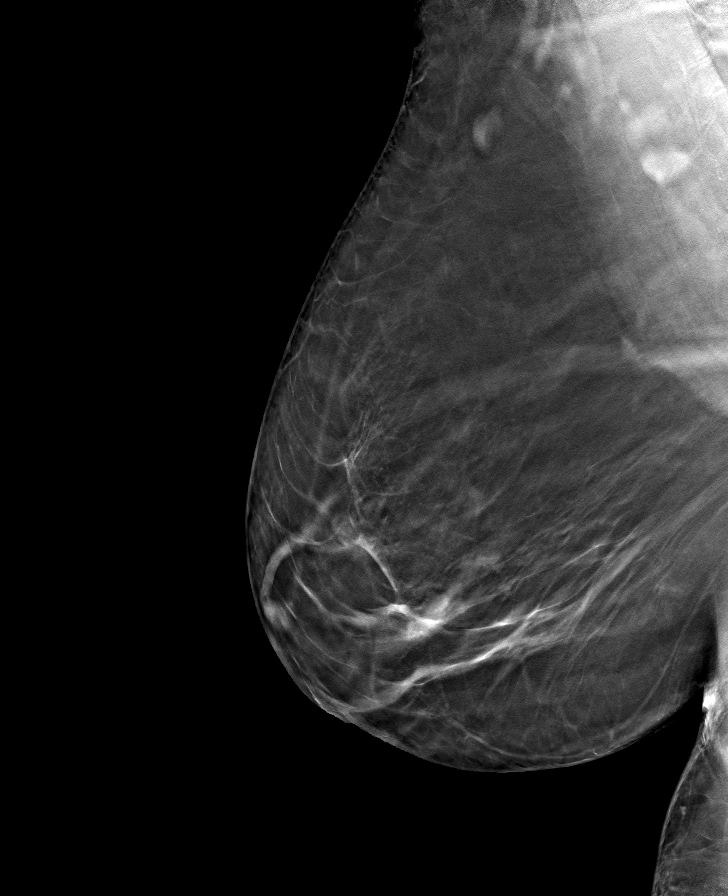

[L CC tomo · tomo slice 41/80.0]
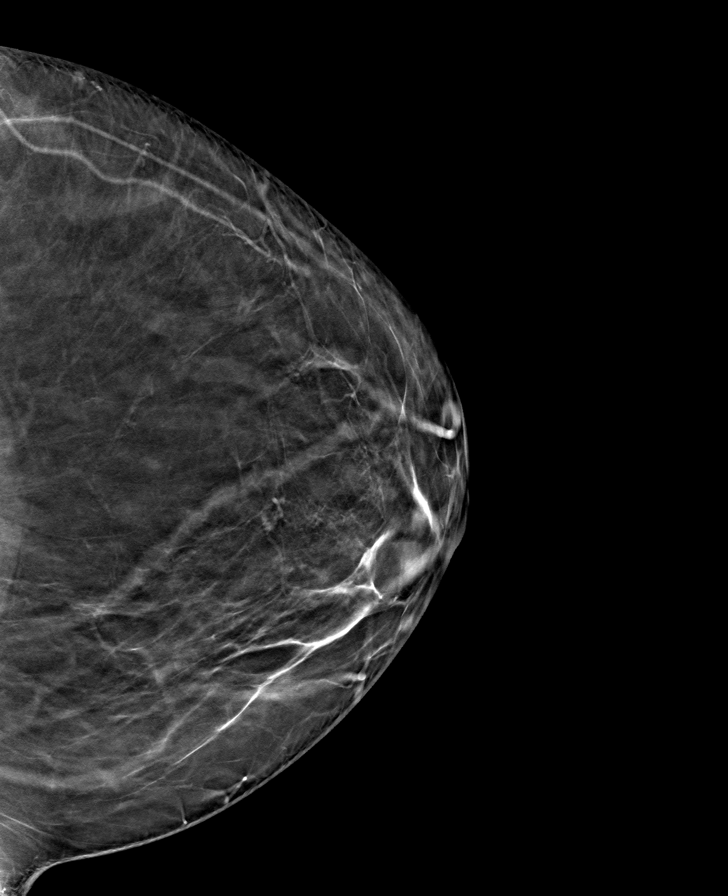

[L MLO tomo · tomo slice 45/89.0]
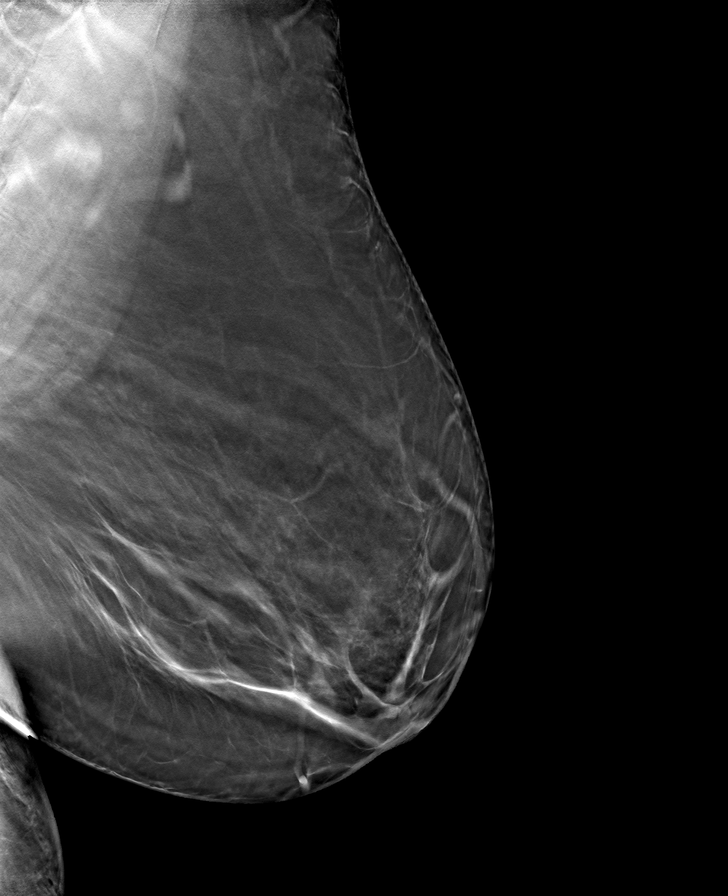

[R CC tomo · tomo slice 41/81.0]
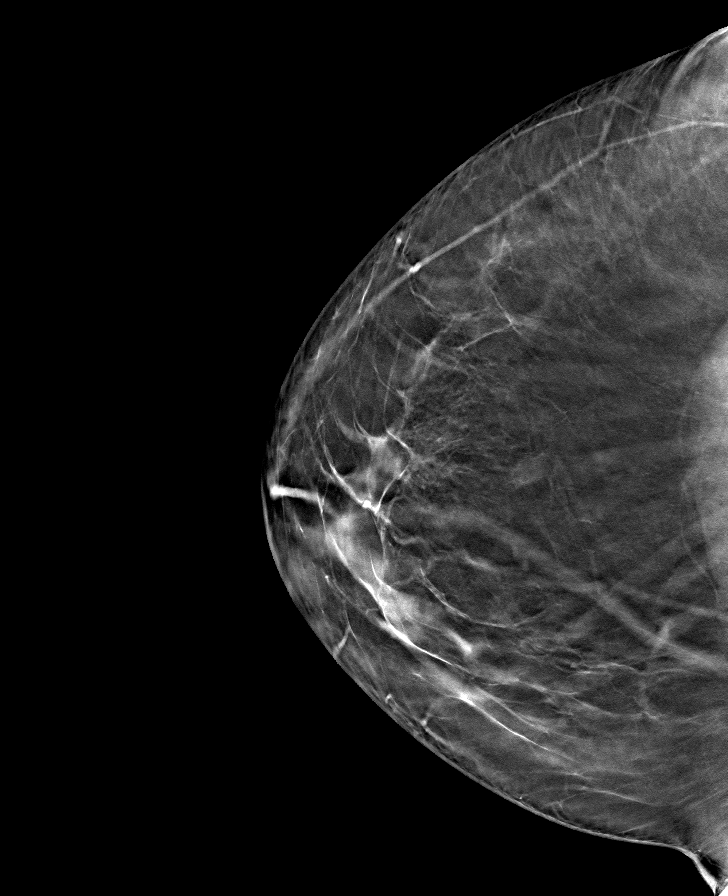

[8 of 24 positions shown; findings below may reference images not displayed]

ACR Breast Density Category b: There are scattered areas of
fibroglandular density.
FINDINGS: In the right breast, a possible asymmetry warrants further
evaluation. In the left breast, no findings suspicious for
malignancy.
IMPRESSION: Further evaluation is suggested for possible asymmetry in the right
breast.

RECOMMENDATION:
Diagnostic mammogram and possibly ultrasound of the right breast.
(Code:BU-C-IIK)

The patient will be contacted regarding the findings, and additional
imaging will be scheduled.

BI-RADS CATEGORY  0: Incomplete. Need additional imaging evaluation
and/or prior mammograms for comparison.

## 2023-07-26 DIAGNOSIS — M25551 Pain in right hip: Secondary | ICD-10-CM | POA: Diagnosis not present

## 2023-07-26 DIAGNOSIS — R1031 Right lower quadrant pain: Secondary | ICD-10-CM | POA: Diagnosis not present

## 2023-09-04 DIAGNOSIS — M25551 Pain in right hip: Secondary | ICD-10-CM | POA: Diagnosis not present

## 2023-09-14 DIAGNOSIS — K219 Gastro-esophageal reflux disease without esophagitis: Secondary | ICD-10-CM | POA: Diagnosis not present

## 2023-09-14 DIAGNOSIS — E1122 Type 2 diabetes mellitus with diabetic chronic kidney disease: Secondary | ICD-10-CM | POA: Diagnosis not present

## 2023-09-14 DIAGNOSIS — M1990 Unspecified osteoarthritis, unspecified site: Secondary | ICD-10-CM | POA: Diagnosis not present

## 2023-09-14 DIAGNOSIS — E785 Hyperlipidemia, unspecified: Secondary | ICD-10-CM | POA: Diagnosis not present

## 2023-09-14 DIAGNOSIS — Z8249 Family history of ischemic heart disease and other diseases of the circulatory system: Secondary | ICD-10-CM | POA: Diagnosis not present

## 2023-09-14 DIAGNOSIS — F419 Anxiety disorder, unspecified: Secondary | ICD-10-CM | POA: Diagnosis not present

## 2023-09-14 DIAGNOSIS — E669 Obesity, unspecified: Secondary | ICD-10-CM | POA: Diagnosis not present

## 2023-09-14 DIAGNOSIS — I471 Supraventricular tachycardia, unspecified: Secondary | ICD-10-CM | POA: Diagnosis not present

## 2023-09-14 DIAGNOSIS — R32 Unspecified urinary incontinence: Secondary | ICD-10-CM | POA: Diagnosis not present

## 2023-09-18 ENCOUNTER — Other Ambulatory Visit: Payer: Self-pay

## 2023-09-18 ENCOUNTER — Ambulatory Visit: Attending: Sports Medicine

## 2023-09-18 DIAGNOSIS — M6281 Muscle weakness (generalized): Secondary | ICD-10-CM | POA: Insufficient documentation

## 2023-09-18 DIAGNOSIS — R2689 Other abnormalities of gait and mobility: Secondary | ICD-10-CM | POA: Insufficient documentation

## 2023-09-18 DIAGNOSIS — M25551 Pain in right hip: Secondary | ICD-10-CM | POA: Diagnosis not present

## 2023-09-18 NOTE — Therapy (Signed)
 OUTPATIENT PHYSICAL THERAPY LOWER EXTREMITY EVALUATION   Patient Name: Robyn Mathews MRN: [id number] DOB:1941-05-08, 83 y.o., female Today's Date: 09/18/2023  END OF SESSION:  PT End of Session - 09/18/23 1212     Visit Number 1    Number of Visits 9    Date for PT Re-Evaluation 11/13/23    Authorization Type [employer] MCR    PT Start Time 1100    PT Stop Time 1140    PT Time Calculation (min) 40 min    Activity Tolerance Patient tolerated treatment well    Behavior During Therapy WFL for tasks assessed/performed             Past Medical History:  Diagnosis Date   Arthritis    knees, toes   Cancer (HCC)    muscle right leg-surgery   Chronic kidney disease    stage 3 kidney disease-being followed Dr. Eula Listen   Diabetes mellitus without complication (HCC)    Fibromyalgia    GERD (gastroesophageal reflux disease)    Heart palpitations    PVC's   Hyperlipidemia    statin intolerant   Hypertension    Pulmonary emboli (HCC)    5'07   Past Surgical History:  Procedure Laterality Date   ABDOMINAL HYSTERECTOMY     '13   bladder tack     COLONOSCOPY WITH PROPOFOL N/A 05/07/2013   Procedure: COLONOSCOPY WITH PROPOFOL;  Surgeon: Charolett Bumpers, MD;  Location: [facility];  Service: Endoscopy;  Laterality: N/A;   HAND SURGERY Right    growth removed little finger   LEG SURGERY Right    8'03-growth removed from muscle(cancer)-surgery only   Patient Active Problem List   Diagnosis Date Noted   PVC's (premature ventricular contractions) 08/14/2014   Palpitations 09/16/2013   Sinus tachycardia 09/16/2013   Hypertension     PCP: Georgann Housekeeper, MD  REFERRING PROVIDER: Hurman Horn, MD  REFERRING DIAG: [phone] (ICD-10-CM) - Pain in right hip   THERAPY DIAG:  Pain in right hip - Plan: PT plan of care cert/re-cert  Muscle weakness (generalized) - Plan: PT plan of care cert/re-cert  Other abnormalities of gait and mobility - Plan: PT plan of care  cert/re-cert  Rationale for Evaluation and Treatment: Rehabilitation  ONSET DATE: 1 month  SUBJECTIVE:   SUBJECTIVE STATEMENT: Pt presents to PT with reports of recent onset of R hip and groin pain. Denies falls or MOI, pain has been gradually increasing but recent has started feeling better. Also denies N/T down R LE, has some hx of lower back discomfort. 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 Feels frustrated by decrease in standing and walking activity tolerance and need for use of SPC.   PERTINENT HISTORY: Liposarcoma removal R thigh 2003, DM II, HTN, Fibromyalgia  PAIN:  Are you having pain?  Yes: NPRS scale: 0/10 Worst: 9/10 Pain location: R groin Pain description: sharp, stabbing Aggravating factors: walking Relieving factors: none  PRECAUTIONS: None  RED FLAGS: None   WEIGHT BEARING RESTRICTIONS: No  FALLS:  Has patient fallen in last 6 months? No  LIVING ENVIRONMENT: Lives with: lives with their family Lives in: House/apartment Stairs: Yes: External: 3 steps; bilateral but cannot reach both Has following equipment at home: Single point cane  OCCUPATION: Retired  PLOF: Independent  PATIENT GOALS: improve her walking distance and ability, decrease R hip pain  NEXT MD VISIT: 10/30/2023  OBJECTIVE:  Note: Objective measures were completed at Evaluation unless otherwise noted.  DIAGNOSTIC FINDINGS:  See imaging   PATIENT SURVEYS:  LEFS: 27/80  COGNITION: Overall cognitive status: Within functional limits for tasks assessed     SENSATION: WFL  POSTURE: rounded shoulders and forward head  PALPATION: TTP to lateral R gluteals  LOWER EXTREMITY ROM:  Active ROM Right eval Left eval  Hip flexion    Hip extension    Hip abduction    Hip adduction    Hip internal rotation 11 20  Hip external rotation [facility]  Knee flexion    Knee extension    Ankle dorsiflexion    Ankle plantarflexion    Ankle inversion    Ankle eversion      (Blank rows = not tested)  LOWER EXTREMITY MMT:  MMT Right eval Left eval  Hip flexion 3+/5 3+/5  Hip extension    Hip abduction 3/5 3+/5  Hip adduction 4/5 4/5  Hip internal rotation 4/5 4/5  Hip external rotation 4/5 4/5  Knee flexion    Knee extension    Ankle dorsiflexion    Ankle plantarflexion    Ankle inversion    Ankle eversion     (Blank rows = not tested)  LOWER EXTREMITY SPECIAL TESTS:  Hip special tests: Luisa Hart (FABER) test: positive  and Hip scouring test: negative  FUNCTIONAL TESTS:  30 Second Sit to Stand: 8 reps with UE  GAIT: Distance walked: 82ft Assistive device utilized: Single point cane Level of assistance: Modified independence Comments: antalgic gait and trendelenburg    TREATMENT: [facility] Adult PT Treatment:                                                 Therapeutic Exercise: Hooklying clam x 5 GTB Supine QS x 5 - 5" hold Supine SLR x 5 each Bridge x 5  PATIENT EDUCATION:  Education details: eval findings, LEFS, HEP, POC Person educated: Patient Education method: Explanation, Demonstration, and Handouts Education comprehension: verbalized understanding and returned demonstration  HOME EXERCISE PROGRAM: Access Code: [email or web address] URL: [email or web address] Date: 09/18/2023 Prepared by: Edwinna Areola  Exercises - Hooklying Clamshell with Resistance  - 1 x daily - 7 x weekly - 2-3 sets - 10 reps - green band hold - Supine Quadricep Sets  - 1 x daily - 7 x weekly - 2-3 sets - 10 reps - 3 sec hold - Active Straight Leg Raise with Quad Set  - 1 x daily - 7 x weekly - 2 sets - 10 reps - Supine Bridge  - 1 x daily - 7 x weekly - 2 sets - 10 reps  ASSESSMENT:  CLINICAL IMPRESSION: Patient is a 83 y.o. F who was seen today for physical therapy evaluation and treatment for recent onset of R hip pain. Physical findings are consistent with MD impression as pt demonstrates R hip weakness and decrease in functional mobility. LEFS shows  severe disability in performance of home ADLs and higher level activities. Pt would benefit from skilled PT services working on improving hip strength and gait tolerance in order to decrease pain.   OBJECTIVE IMPAIRMENTS: Abnormal gait, decreased activity tolerance, decreased balance, decreased mobility, difficulty walking, decreased ROM, decreased strength, and pain   ACTIVITY LIMITATIONS: carrying, lifting, standing, squatting, stairs, transfers, and locomotion level  PARTICIPATION LIMITATIONS: shopping, community activity, occupation, and yard work  PERSONAL FACTORS: 3+ comorbidities: Liposarcoma removal R thigh 2003, DM II, HTN,  Fibromyalgia  are also affecting patient's functional outcome.   REHAB POTENTIAL: Good  CLINICAL DECISION MAKING: Stable/uncomplicated  EVALUATION COMPLEXITY: Low   GOALS: Goals reviewed with patient? No  SHORT TERM GOALS: Target date: 10/09/2023   Pt will be compliant and knowledgeable with initial HEP for improved comfort and carryover Baseline: initial HEP given  Goal status: INITIAL  2.  Pt will self report R hip pain no greater than 6/10 for improved comfort and functional ability Baseline: 9/10 at worst Goal status: INITIAL   LONG TERM GOALS: Target date: 11/13/2023   Pt will improve LEFS to no less than 35/80 as proxy for functional improvement with home ADLs and higher level community activity Baseline: 27/80 Goal status: INITIAL   2.  Pt will self report R hip pain no greater than 3/10 for improved comfort and functional ability Baseline: 9/10 at worst Goal status: INITIAL   3.  Pt will increase 30 Second Sit to Stand rep count to no less than 10 reps for improved balance, strength, and functional mobility Baseline: 8 reps with UE Goal status: INITIAL   4.  Pt will improve standing and walking activity tolerance to no less than 30 minutes for improved community navigation and ability to go shopping Baseline: 10 minutes with AD Goal  status: INITIAL  5.  Pt will improve R hip IR ROM to no less than 17 degrees for improved functional mobility and decrease pain Baseline: 11 degrees Goal status: INITIAL    PLAN:  PT FREQUENCY: 1x/week  PT DURATION: 8 weeks  PLANNED INTERVENTIONS: 97164- PT Re-evaluation, 97110-Therapeutic exercises, 97530- Therapeutic activity, [id number]- Neuromuscular re-education, 97535- Self Care, [id number]- Manual therapy, [id number]- Gait training, O1308- Electrical stimulation (unattended), [id number]- Electrical stimulation (manual), Dry Needling, Cryotherapy, and Moist heat  PLAN FOR NEXT SESSION: assess HEP response, hip strengthening, gait training, IR stretching  Referring diagnosis?  M25.551 (ICD-10-CM) - Pain in right hip  Treatment diagnosis? (if different than referring diagnosis)  Pain in right hip Muscle weakness (generalized) Other abnormalities of gait and mobility  What was this (referring dx) caused by? []  Surgery []  Fall []  Ongoing issue [x]  Arthritis []  Other: ____________  Laterality: [x]  Rt []  Lt []  Both  Check all possible CPT codes:  *CHOOSE 10 OR LESS*    See Planned Interventions listed in the Plan section of the Evaluation.    Eloy End, PT 09/18/2023, 12:18 PM

## 2023-09-22 DIAGNOSIS — H26493 Other secondary cataract, bilateral: Secondary | ICD-10-CM | POA: Diagnosis not present

## 2023-09-22 DIAGNOSIS — H524 Presbyopia: Secondary | ICD-10-CM | POA: Diagnosis not present

## 2023-09-22 DIAGNOSIS — E119 Type 2 diabetes mellitus without complications: Secondary | ICD-10-CM | POA: Diagnosis not present

## 2023-09-25 ENCOUNTER — Ambulatory Visit: Attending: Sports Medicine

## 2023-09-25 DIAGNOSIS — M25551 Pain in right hip: Secondary | ICD-10-CM | POA: Insufficient documentation

## 2023-09-25 DIAGNOSIS — R2689 Other abnormalities of gait and mobility: Secondary | ICD-10-CM | POA: Insufficient documentation

## 2023-09-25 DIAGNOSIS — M6281 Muscle weakness (generalized): Secondary | ICD-10-CM | POA: Diagnosis not present

## 2023-09-25 NOTE — Therapy (Signed)
 OUTPATIENT PHYSICAL THERAPY TREATMENT   Patient Name: Robyn Mathews MRN: [id number] DOB:July 17, 1940, 83 y.o., female Today's Date: 09/25/2023  END OF SESSION:  PT End of Session - 09/25/23 1259     Visit Number 2    Number of Visits 9    Date for PT Re-Evaluation 11/13/23    Authorization Type [employer] MCR    Authorization Time Period Approved 8 visits   09/18/2023 - 11/07/2023    Authorization - Visit Number 1    Authorization - Number of Visits 8    PT Start Time 1315    PT Stop Time 1355    PT Time Calculation (min) 40 min    Activity Tolerance Patient tolerated treatment well    Behavior During Therapy [facility] for tasks assessed/performed              Past Medical History:  Diagnosis Date   Arthritis    knees, toes   Cancer (HCC)    muscle right leg-surgery   Chronic kidney disease    stage 3 kidney disease-being followed Dr. Eula Listen   Diabetes mellitus without complication (HCC)    Fibromyalgia    GERD (gastroesophageal reflux disease)    Heart palpitations    PVC's   Hyperlipidemia    statin intolerant   Hypertension    Pulmonary emboli (HCC)    5'07   Past Surgical History:  Procedure Laterality Date   ABDOMINAL HYSTERECTOMY     '13   bladder tack     COLONOSCOPY WITH PROPOFOL N/A 05/07/2013   Procedure: COLONOSCOPY WITH PROPOFOL;  Surgeon: Charolett Bumpers, MD;  Location: [facility];  Service: Endoscopy;  Laterality: N/A;   HAND SURGERY Right    growth removed little finger   LEG SURGERY Right    8'03-growth removed from muscle(cancer)-surgery only   Patient Active Problem List   Diagnosis Date Noted   PVC's (premature ventricular contractions) 08/14/2014   Palpitations 09/16/2013   Sinus tachycardia 09/16/2013   Hypertension     PCP: Georgann Housekeeper, MD  REFERRING PROVIDER: Hurman Horn, MD  REFERRING DIAG: [phone] (ICD-10-CM) - Pain in right hip   THERAPY DIAG:  Pain in right hip  Muscle weakness (generalized)  Other abnormalities  of gait and mobility  Rationale for Evaluation and Treatment: Rehabilitation  ONSET DATE: 1 month  SUBJECTIVE:   SUBJECTIVE STATEMENT: Pt presents to PT with reports of no current pain. Has been compliant with HEP with no adverse effect.   EVAL: Pt presents to PT with reports of recent onset of R hip and groin pain. Denies falls or MOI, pain has been gradually increasing but recent has started feeling better. Also denies N/T down R LE, has some hx of lower back discomfort. 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 Feels frustrated by decrease in standing and walking activity tolerance and need for use of SPC.   PERTINENT HISTORY: Liposarcoma removal R thigh 2003, DM II, HTN, Fibromyalgia  PAIN:  Are you having pain?  Yes: NPRS scale: 0/10 Worst: 9/10 Pain location: R groin Pain description: sharp, stabbing Aggravating factors: walking Relieving factors: none  PRECAUTIONS: None  RED FLAGS: None   WEIGHT BEARING RESTRICTIONS: No  FALLS:  Has patient fallen in last 6 months? No  LIVING ENVIRONMENT: Lives with: lives with their family Lives in: House/apartment Stairs: Yes: External: 3 steps; bilateral but cannot reach both Has following equipment at home: Single point cane  OCCUPATION: Retired  PLOF: Independent  PATIENT GOALS:  improve her walking distance and ability, decrease R hip pain  NEXT MD VISIT: 10/30/2023  OBJECTIVE:  Note: Objective measures were completed at Evaluation unless otherwise noted.  DIAGNOSTIC FINDINGS: See imaging   PATIENT SURVEYS:  LEFS: 27/80  COGNITION: Overall cognitive status: Within functional limits for tasks assessed     SENSATION: WFL  POSTURE: rounded shoulders and forward head  PALPATION: TTP to lateral R gluteals  LOWER EXTREMITY ROM:  Active ROM Right eval Left eval  Hip flexion    Hip extension    Hip abduction    Hip adduction    Hip internal rotation 11 20  Hip external rotation [facility]  Knee  flexion    Knee extension    Ankle dorsiflexion    Ankle plantarflexion    Ankle inversion    Ankle eversion     (Blank rows = not tested)  LOWER EXTREMITY MMT:  MMT Right eval Left eval  Hip flexion 3+/5 3+/5  Hip extension    Hip abduction 3/5 3+/5  Hip adduction 4/5 4/5  Hip internal rotation 4/5 4/5  Hip external rotation 4/5 4/5  Knee flexion    Knee extension    Ankle dorsiflexion    Ankle plantarflexion    Ankle inversion    Ankle eversion     (Blank rows = not tested)  LOWER EXTREMITY SPECIAL TESTS:  Hip special tests: Luisa Hart (FABER) test: positive  and Hip scouring test: negative  FUNCTIONAL TESTS:  30 Second Sit to Stand: 8 reps with UE  GAIT: Distance walked: 70ft Assistive device utilized: Single point cane Level of assistance: Modified independence Comments: antalgic gait and trendelenburg    TREATMENT: [facility] Adult PT Treatment:                                                 Therapeutic Exercise: LAQ 2x10 2.5# each Supine SLR 2x10 each S/L hip abd 2x10 Bridge 2x10 Seated clamshell 2x20 blue band Standing hip abd 2x10 each - BUE support Therapeutic Activity: NuStep lvl 4 UE/LE x 4 min for improving functional activity tolerance STS 2x10 - no UE support Functional mini squat 2x10 - B UE  PATIENT EDUCATION:  Education details: continue HEP Person educated: Patient Education method: Explanation, Demonstration, and Handouts Education comprehension: verbalized understanding and returned demonstration  HOME EXERCISE PROGRAM: Access Code: [email or web address] URL: [email or web address] Date: 09/25/2023 Prepared by: Edwinna Areola  Exercises - Hooklying Clamshell with Resistance  - 1 x daily - 7 x weekly - 2-3 sets - 10 reps - green band hold - Supine Quadricep Sets  - 1 x daily - 7 x weekly - 2-3 sets - 10 reps - 3 sec hold - Active Straight Leg Raise with Quad Set  - 1 x daily - 7 x weekly - 2 sets - 10 reps - Supine Bridge  - 1 x daily - 7  x weekly - 2 sets - 10 reps - Sidelying Hip Abduction  - 1 x daily - 7 x weekly - 2 sets - 10 reps  ASSESSMENT:  CLINICAL IMPRESSION: Pt was able to complete all prescribed exercises with no adverse effect. Therapy focused on improving proximal hip strength in order to decrease pain and functional activity tolerance and mobility in order to improve ADL performance. Pt is progressing well with therpay, HEP updated for continued hip strengthening. Will continue  to progress as able per POC.   EVAL: Patient is a 83 y.o. F who was seen today for physical therapy evaluation and treatment for recent onset of R hip pain. Physical findings are consistent with MD impression as pt demonstrates R hip weakness and decrease in functional mobility. LEFS shows severe disability in performance of home ADLs and higher level activities. Pt would benefit from skilled PT services working on improving hip strength and gait tolerance in order to decrease pain.   OBJECTIVE IMPAIRMENTS: Abnormal gait, decreased activity tolerance, decreased balance, decreased mobility, difficulty walking, decreased ROM, decreased strength, and pain   ACTIVITY LIMITATIONS: carrying, lifting, standing, squatting, stairs, transfers, and locomotion level  PARTICIPATION LIMITATIONS: shopping, community activity, occupation, and yard work  PERSONAL FACTORS: 3+ comorbidities: Liposarcoma removal R thigh 2003, DM II, HTN, Fibromyalgia  are also affecting patient's functional outcome.   REHAB POTENTIAL: Good  CLINICAL DECISION MAKING: Stable/uncomplicated  EVALUATION COMPLEXITY: Low   GOALS: Goals reviewed with patient? No  SHORT TERM GOALS: Target date: 10/09/2023   Pt will be compliant and knowledgeable with initial HEP for improved comfort and carryover Baseline: initial HEP given  Goal status: INITIAL  2.  Pt will self report R hip pain no greater than 6/10 for improved comfort and functional ability Baseline: 9/10 at  worst Goal status: INITIAL   LONG TERM GOALS: Target date: 11/13/2023   Pt will improve LEFS to no less than 35/80 as proxy for functional improvement with home ADLs and higher level community activity Baseline: 27/80 Goal status: INITIAL   2.  Pt will self report R hip pain no greater than 3/10 for improved comfort and functional ability Baseline: 9/10 at worst Goal status: INITIAL   3.  Pt will increase 30 Second Sit to Stand rep count to no less than 10 reps for improved balance, strength, and functional mobility Baseline: 8 reps with UE Goal status: INITIAL   4.  Pt will improve standing and walking activity tolerance to no less than 30 minutes for improved community navigation and ability to go shopping Baseline: 10 minutes with AD Goal status: INITIAL  5.  Pt will improve R hip IR ROM to no less than 17 degrees for improved functional mobility and decrease pain Baseline: 11 degrees Goal status: INITIAL    PLAN:  PT FREQUENCY: 1x/week  PT DURATION: 8 weeks  PLANNED INTERVENTIONS: 97164- PT Re-evaluation, 97110-Therapeutic exercises, 97530- Therapeutic activity, [id number]- Neuromuscular re-education, 97535- Self Care, 91478- Manual therapy, [id number]- Gait training, G9562- Electrical stimulation (unattended), [id number]- Electrical stimulation (manual), Dry Needling, Cryotherapy, and Moist heat  PLAN FOR NEXT SESSION: assess HEP response, hip strengthening, gait training, IR stretching  Referring diagnosis?  M25.551 (ICD-10-CM) - Pain in right hip  Treatment diagnosis? (if different than referring diagnosis)  Pain in right hip Muscle weakness (generalized) Other abnormalities of gait and mobility  What was this (referring dx) caused by? []  Surgery []  Fall []  Ongoing issue [x]  Arthritis []  Other: ____________  Laterality: [x]  Rt []  Lt []  Both  Check all possible CPT codes:  *CHOOSE 10 OR LESS*    See Planned Interventions listed in the Plan section of the Evaluation.     Eloy End, PT 09/25/2023, 1:57 PM

## 2023-10-03 ENCOUNTER — Ambulatory Visit

## 2023-10-03 DIAGNOSIS — R2689 Other abnormalities of gait and mobility: Secondary | ICD-10-CM | POA: Diagnosis not present

## 2023-10-03 DIAGNOSIS — M25551 Pain in right hip: Secondary | ICD-10-CM

## 2023-10-03 DIAGNOSIS — M6281 Muscle weakness (generalized): Secondary | ICD-10-CM | POA: Diagnosis not present

## 2023-10-03 NOTE — Therapy (Signed)
 OUTPATIENT PHYSICAL THERAPY TREATMENT   Patient Name: Robyn Mathews MRN: [id number] DOB:05/13/1941, 83 y.o., female Today's Date: 10/04/2023  END OF SESSION:  PT End of Session - 10/03/23 1515     Visit Number 3    Number of Visits 9    Date for PT Re-Evaluation 11/13/23    Authorization Type [employer] MCR    Authorization Time Period Approved 8 visits   09/18/2023 - 11/07/2023    Authorization - Visit Number 2    Authorization - Number of Visits 8    PT Start Time 1530    PT Stop Time 1608    PT Time Calculation (min) 38 min    Activity Tolerance Patient tolerated treatment well    Behavior During Therapy WFL for tasks assessed/performed               Past Medical History:  Diagnosis Date   Arthritis    knees, toes   Cancer (HCC)    muscle right leg-surgery   Chronic kidney disease    stage 3 kidney disease-being followed Dr. Eula Listen   Diabetes mellitus without complication (HCC)    Fibromyalgia    GERD (gastroesophageal reflux disease)    Heart palpitations    PVC's   Hyperlipidemia    statin intolerant   Hypertension    Pulmonary emboli (HCC)    5'07   Past Surgical History:  Procedure Laterality Date   ABDOMINAL HYSTERECTOMY     '13   bladder tack     COLONOSCOPY WITH PROPOFOL N/A 05/07/2013   Procedure: COLONOSCOPY WITH PROPOFOL;  Surgeon: Charolett Bumpers, MD;  Location: [facility];  Service: Endoscopy;  Laterality: N/A;   HAND SURGERY Right    growth removed little finger   LEG SURGERY Right    8'03-growth removed from muscle(cancer)-surgery only   Patient Active Problem List   Diagnosis Date Noted   PVC's (premature ventricular contractions) 08/14/2014   Palpitations 09/16/2013   Sinus tachycardia 09/16/2013   Hypertension     PCP: Georgann Housekeeper, MD  REFERRING PROVIDER: Hurman Horn, MD  REFERRING DIAG: [phone] (ICD-10-CM) - Pain in right hip   THERAPY DIAG:  Pain in right hip  Muscle weakness (generalized)  Other  abnormalities of gait and mobility  Rationale for Evaluation and Treatment: Rehabilitation  ONSET DATE: 1 month  SUBJECTIVE:   SUBJECTIVE STATEMENT: Pt presents to PT with no reports of pain. Has been compliant with HEP.   EVAL: Pt presents to PT with reports of recent onset of R hip and groin pain. Denies falls or MOI, pain has been gradually increasing but recent has started feeling better. Also denies N/T down R LE, has some hx of lower back discomfort. 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 Feels frustrated by decrease in standing and walking activity tolerance and need for use of SPC.   PERTINENT HISTORY: Liposarcoma removal R thigh 2003, DM II, HTN, Fibromyalgia  PAIN:  Are you having pain?  Yes: NPRS scale: 0/10 Worst: 9/10 Pain location: R groin Pain description: sharp, stabbing Aggravating factors: walking Relieving factors: none  PRECAUTIONS: None  RED FLAGS: None   WEIGHT BEARING RESTRICTIONS: No  FALLS:  Has patient fallen in last 6 months? No  LIVING ENVIRONMENT: Lives with: lives with their family Lives in: House/apartment Stairs: Yes: External: 3 steps; bilateral but cannot reach both Has following equipment at home: Single point cane  OCCUPATION: Retired  PLOF: Independent  PATIENT GOALS: improve her walking distance  and ability, decrease R hip pain  NEXT MD VISIT: 10/30/2023  OBJECTIVE:  Note: Objective measures were completed at Evaluation unless otherwise noted.  DIAGNOSTIC FINDINGS: See imaging   PATIENT SURVEYS:  LEFS: 27/80  COGNITION: Overall cognitive status: Within functional limits for tasks assessed     SENSATION: WFL  POSTURE: rounded shoulders and forward head  PALPATION: TTP to lateral R gluteals  LOWER EXTREMITY ROM:  Active ROM Right eval Left eval  Hip flexion    Hip extension    Hip abduction    Hip adduction    Hip internal rotation 11 20  Hip external rotation [facility]  Knee flexion    Knee  extension    Ankle dorsiflexion    Ankle plantarflexion    Ankle inversion    Ankle eversion     (Blank rows = not tested)  LOWER EXTREMITY MMT:  MMT Right eval Left eval  Hip flexion 3+/5 3+/5  Hip extension    Hip abduction 3/5 3+/5  Hip adduction 4/5 4/5  Hip internal rotation 4/5 4/5  Hip external rotation 4/5 4/5  Knee flexion    Knee extension    Ankle dorsiflexion    Ankle plantarflexion    Ankle inversion    Ankle eversion     (Blank rows = not tested)  LOWER EXTREMITY SPECIAL TESTS:  Hip special tests: Portia Brittle (FABER) test: positive  and Hip scouring test: negative  FUNCTIONAL TESTS:  30 Second Sit to Stand: 8 reps with UE  GAIT: Distance walked: 66ft Assistive device utilized: Single point cane Level of assistance: Modified independence Comments: antalgic gait and trendelenburg    TREATMENT: [facility] Adult PT Treatment:                                                 Therapeutic Exercise: LAQ 2x10 2.5# each Supine SLR 3x10 each S/L hip abd 2x10 Bridge 2x10 Lateral walk YTB x 3 laps at counter Standing hip abd 2x10 each YTB - BUE support Therapeutic Activity: NuStep lvl 5 UE/LE x 4 min for improving functional activity tolerance STS 2x10 - no UE support Functional mini squat 2x10 - B UE  PATIENT EDUCATION:  Education details: continue HEP Person educated: Patient Education method: Explanation, Demonstration, and Handouts Education comprehension: verbalized understanding and returned demonstration  HOME EXERCISE PROGRAM: Access Code: [email or web address] URL: [email or web address] Date: 09/25/2023 Prepared by: Loral Roch  Exercises - Hooklying Clamshell with Resistance  - 1 x daily - 7 x weekly - 2-3 sets - 10 reps - green band hold - Supine Quadricep Sets  - 1 x daily - 7 x weekly - 2-3 sets - 10 reps - 3 sec hold - Active Straight Leg Raise with Quad Set  - 1 x daily - 7 x weekly - 2 sets - 10 reps - Supine Bridge  - 1 x daily - 7 x  weekly - 2 sets - 10 reps - Sidelying Hip Abduction  - 1 x daily - 7 x weekly - 2 sets - 10 reps  ASSESSMENT:  CLINICAL IMPRESSION: Pt was able to complete all prescribed exercises with no adverse effect. Therapy focused on improving proximal hip strength in order to decrease pain and functional activity tolerance and mobility in order to improve ADL performance. Pt is progressing well with therpay, HEP updated for continued hip strengthening. Will assess  goals and possible discharge potential at next session.   EVAL: Patient is a 83 y.o. F who was seen today for physical therapy evaluation and treatment for recent onset of R hip pain. Physical findings are consistent with MD impression as pt demonstrates R hip weakness and decrease in functional mobility. LEFS shows severe disability in performance of home ADLs and higher level activities. Pt would benefit from skilled PT services working on improving hip strength and gait tolerance in order to decrease pain.   OBJECTIVE IMPAIRMENTS: Abnormal gait, decreased activity tolerance, decreased balance, decreased mobility, difficulty walking, decreased ROM, decreased strength, and pain   ACTIVITY LIMITATIONS: carrying, lifting, standing, squatting, stairs, transfers, and locomotion level  PARTICIPATION LIMITATIONS: shopping, community activity, occupation, and yard work  PERSONAL FACTORS: 3+ comorbidities: Liposarcoma removal R thigh 2003, DM II, HTN, Fibromyalgia  are also affecting patient's functional outcome.   REHAB POTENTIAL: Good  CLINICAL DECISION MAKING: Stable/uncomplicated  EVALUATION COMPLEXITY: Low   GOALS: Goals reviewed with patient? No  SHORT TERM GOALS: Target date: 10/09/2023   Pt will be compliant and knowledgeable with initial HEP for improved comfort and carryover Baseline: initial HEP given  Goal status: INITIAL  2.  Pt will self report R hip pain no greater than 6/10 for improved comfort and functional  ability Baseline: 9/10 at worst Goal status: INITIAL   LONG TERM GOALS: Target date: 11/13/2023   Pt will improve LEFS to no less than 35/80 as proxy for functional improvement with home ADLs and higher level community activity Baseline: 27/80 Goal status: INITIAL   2.  Pt will self report R hip pain no greater than 3/10 for improved comfort and functional ability Baseline: 9/10 at worst Goal status: INITIAL   3.  Pt will increase 30 Second Sit to Stand rep count to no less than 10 reps for improved balance, strength, and functional mobility Baseline: 8 reps with UE Goal status: INITIAL   4.  Pt will improve standing and walking activity tolerance to no less than 30 minutes for improved community navigation and ability to go shopping Baseline: 10 minutes with AD Goal status: INITIAL  5.  Pt will improve R hip IR ROM to no less than 17 degrees for improved functional mobility and decrease pain Baseline: 11 degrees Goal status: INITIAL    PLAN:  PT FREQUENCY: 1x/week  PT DURATION: 8 weeks  PLANNED INTERVENTIONS: 97164- PT Re-evaluation, 97110-Therapeutic exercises, 97530- Therapeutic activity, [id number]- Neuromuscular re-education, 97535- Self Care, [id number]- Manual therapy, [id number]- Gait training, Y8657- Electrical stimulation (unattended), [id number]- Electrical stimulation (manual), Dry Needling, Cryotherapy, and Moist heat  PLAN FOR NEXT SESSION: assess HEP response, hip strengthening, gait training, IR stretching  Referring diagnosis?  M25.551 (ICD-10-CM) - Pain in right hip  Treatment diagnosis? (if different than referring diagnosis)  Pain in right hip Muscle weakness (generalized) Other abnormalities of gait and mobility  What was this (referring dx) caused by? []  Surgery []  Fall []  Ongoing issue [x]  Arthritis []  Other: ____________  Laterality: [x]  Rt []  Lt []  Both  Check all possible CPT codes:  *CHOOSE 10 OR LESS*    See Planned Interventions listed in the Plan  section of the Evaluation.    Eloy End, PT 10/04/2023, 8:15 AM

## 2023-10-09 ENCOUNTER — Encounter

## 2023-10-09 DIAGNOSIS — H26491 Other secondary cataract, right eye: Secondary | ICD-10-CM | POA: Diagnosis not present

## 2023-10-10 DIAGNOSIS — N1831 Chronic kidney disease, stage 3a: Secondary | ICD-10-CM | POA: Diagnosis not present

## 2023-10-10 DIAGNOSIS — M109 Gout, unspecified: Secondary | ICD-10-CM | POA: Diagnosis not present

## 2023-10-10 DIAGNOSIS — K219 Gastro-esophageal reflux disease without esophagitis: Secondary | ICD-10-CM | POA: Diagnosis not present

## 2023-10-10 DIAGNOSIS — E782 Mixed hyperlipidemia: Secondary | ICD-10-CM | POA: Diagnosis not present

## 2023-10-10 DIAGNOSIS — I10 Essential (primary) hypertension: Secondary | ICD-10-CM | POA: Diagnosis not present

## 2023-10-10 DIAGNOSIS — G720 Drug-induced myopathy: Secondary | ICD-10-CM | POA: Diagnosis not present

## 2023-10-10 DIAGNOSIS — C499 Malignant neoplasm of connective and soft tissue, unspecified: Secondary | ICD-10-CM | POA: Diagnosis not present

## 2023-10-10 DIAGNOSIS — M179 Osteoarthritis of knee, unspecified: Secondary | ICD-10-CM | POA: Diagnosis not present

## 2023-10-10 DIAGNOSIS — E1122 Type 2 diabetes mellitus with diabetic chronic kidney disease: Secondary | ICD-10-CM | POA: Diagnosis not present

## 2023-10-16 ENCOUNTER — Ambulatory Visit

## 2023-10-23 ENCOUNTER — Ambulatory Visit: Attending: Sports Medicine

## 2023-10-23 DIAGNOSIS — M25551 Pain in right hip: Secondary | ICD-10-CM | POA: Diagnosis not present

## 2023-10-23 DIAGNOSIS — M6281 Muscle weakness (generalized): Secondary | ICD-10-CM | POA: Diagnosis not present

## 2023-10-23 DIAGNOSIS — R2689 Other abnormalities of gait and mobility: Secondary | ICD-10-CM | POA: Diagnosis not present

## 2023-10-23 NOTE — Therapy (Signed)
 OUTPATIENT PHYSICAL THERAPY TREATMENT/DISCHARGE  PHYSICAL THERAPY DISCHARGE SUMMARY  Visits from Start of Care: 4  Current functional level related to goals / functional outcomes: See goals and objective   Remaining deficits: See goals and objective   Education / Equipment: HEP   Patient agrees to discharge. Patient goals were met. Patient is being discharged due to meeting the stated rehab goals.  Patient Name: Robyn Mathews MRN: [id number] DOB:January 13, 1941, 83 y.o.,, female Today's Date: 10/23/2023  END OF SESSION:  PT End of Session - 10/23/23 1305     Visit Number 4    Number of Visits 9    Date for PT Re-Evaluation 11/13/23    Authorization Type [employer] M[employer]    Authorization Time Period Approved 8 visits   09/18/2023 - 11/07/2023    Authorization - Visit Number 3    Authorization - Number of Visits 8    PT Start Time 1310    PT Stop Time 1348    PT Time Calculation (min) 38 min    Activity Tolerance Patient tolerated treatment well    Behavior During Therapy WFL for tasks assessed/performed                Past Medical History:  Diagnosis Date   Arthritis    knees, toes   Cancer (HCC)    muscle right leg-surgery   Chronic kidney disease    stage 3 kidney disease-being followed Dr. Lorena Rolling   Diabetes mellitus without complication (HCC)    Fibromyalgia    GERD (gastroesophageal reflux disease)    Heart palpitations    PVC's   Hyperlipidemia    statin intolerant   Hypertension    Pulmonary emboli (HCC)    5'07   Past Surgical History:  Procedure Laterality Date   ABDOMINAL HYSTERECTOMY     '13   bladder tack     COLONOSCOPY WITH PROPOFOL  N/A 05/07/2013   Procedure: COLONOSCOPY WITH PROPOFOL ;  Surgeon: Garrett Kallman, MD;  Location: [facility];  Service: Endoscopy;  Laterality: N/A;   HAND SURGERY Right    growth removed little finger   LEG SURGERY Right    8'03-growth removed from muscle(cancer)-surgery only   Patient Active Problem List    Diagnosis Date Noted   PVC's (premature ventricular contractions) 08/14/2014   Palpitations 09/16/2013   Sinus tachycardia 09/16/2013   Hypertension     PCP: Jearldine Mina, MD  REFERRING PROVIDER: Rance Burrows, MD  REFERRING DIAG: [phone] (ICD-10-CM) - Pain in right hip   THERAPY DIAG:  Pain in right hip  Muscle weakness (generalized)  Other abnormalities of gait and mobility  Rationale for Evaluation and Treatment: Rehabilitation  ONSET DATE: 1 month  SUBJECTIVE:   SUBJECTIVE STATEMENT: Pt presents to PT with reports of continued improvement and no current pain. Arrives with no AD today and states she has not been using one with no adverse effect. Has been compliant with HEP. She will see referring MD next week.   EVAL: Pt presents to PT with reports of recent onset of R hip and groin pain. Denies falls or MOI, pain has been gradually increasing but recent has started feeling better. Also denies N/T down R LE, has some hx of lower back discomfort. 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 Feels frustrated by decrease in standing and walking activity tolerance and need for use of SPC.   PERTINENT HISTORY: Liposarcoma removal R thigh 2003, DM II, HTN, Fibromyalgia  PAIN:  Are you having  pain?  Yes: NPRS scale: 0/10 Worst: 9/10 Pain location: R groin Pain description: sharp, stabbing Aggravating factors: walking Relieving factors: none  PRECAUTIONS: None  RED FLAGS: None   WEIGHT BEARING RESTRICTIONS: No  FALLS:  Has patient fallen in last 6 months? No  LIVING ENVIRONMENT: Lives with: lives with their family Lives in: House/apartment Stairs: Yes: External: 3 steps; bilateral but cannot reach both Has following equipment at home: Single point cane  OCCUPATION: Retired  PLOF: Independent  PATIENT GOALS: improve her walking distance and ability, decrease R hip pain  NEXT MD VISIT: 10/30/2023  OBJECTIVE:  Note: Objective measures were  completed at Evaluation unless otherwise noted.  DIAGNOSTIC FINDINGS: See imaging   PATIENT SURVEYS:  LEFS: 27/80 10/23/2023: 60/80  COGNITION: Overall cognitive status: Within functional limits for tasks assessed     SENSATION: WFL  POSTURE: rounded shoulders and forward head  PALPATION: TTP to lateral R gluteals  LOWER EXTREMITY ROM:  Active ROM Right eval Left eval Right 10/23/2023  Hip flexion     Hip extension     Hip abduction     Hip adduction     Hip internal rotation 11 20 18   Hip external rotation [facility]   Knee flexion     Knee extension     Ankle dorsiflexion     Ankle plantarflexion     Ankle inversion     Ankle eversion      (Blank rows = not tested)  LOWER EXTREMITY MMT:  MMT Right eval Left eval Right 10/23/2023 Left 10/23/2023  Hip flexion 3+/5 3+/5 4/5 4/5  Hip extension      Hip abduction 3/5 3+/5 4/5 4/5  Hip adduction 4/5 4/5    Hip internal rotation 4/5 4/5    Hip external rotation 4/5 4/5    Knee flexion      Knee extension      Ankle dorsiflexion      Ankle plantarflexion      Ankle inversion      Ankle eversion       (Blank rows = not tested)  LOWER EXTREMITY SPECIAL TESTS:  Hip special tests: Portia Brittle (FABER) test: positive  and Hip scouring test: negative  FUNCTIONAL TESTS:  30 Second Sit to Stand: 8 reps with UE 10/23/2023: 12 reps no UE  GAIT: Distance walked: 60ft Assistive device utilized: Single point cane Level of assistance: Modified independence Comments: antalgic gait and trendelenburg    TREATMENT: [facility] Adult PT Treatment:                                                 Therapeutic Exercise: Hooklying clamshell 2x15 blue band Supine QS x 10 - 5" hold Supine SLR 2x10 each S/L hip abd 2x10 Bridge 2x10 Lateral walk YTB x 3 laps at counter Therapeutic Activity: NuStep lvl 5 UE/LE x 4 min for improving functional activity tolerance STS 2x10 - no UE support Review of tests/measures, goals, and outcomes for  discharge  PATIENT EDUCATION:  Education details: continue HEP Person educated: Patient Education method: Explanation, Demonstration, and Handouts Education comprehension: verbalized understanding and returned demonstration  HOME EXERCISE PROGRAM: Access Code: [email or web address] URL: [email or web address] Date: 09/25/2023 Prepared by: Loral Roch  Exercises - Hooklying Clamshell with Resistance  - 1 x daily - 7 x weekly - 2-3 sets - 10 reps - green band hold -  Supine Quadricep Sets  - 1 x daily - 7 x weekly - 2-3 sets - 10 reps - 3 sec hold - Active Straight Leg Raise with Quad Set  - 1 x daily - 7 x weekly - 2 sets - 10 reps - Supine Bridge  - 1 x daily - 7 x weekly - 2 sets - 10 reps - Sidelying Hip Abduction  - 1 x daily - 7 x weekly - 2 sets - 10 reps  ASSESSMENT:  CLINICAL IMPRESSION: Pt was able to complete prescribed exercises with no adverse effect and demonstrated knowledge of HEP. Over the course of PT treatment she has progressed very well, having no R LE pain over last few weeks and meeting all long term goals with therapy. She should continue to maintain improvement with HEP compliance and is being discharged from skilled PT services at this time.   EVAL: Patient is a 83 y.o. F who was seen today for physical therapy evaluation and treatment for recent onset of R hip pain. Physical findings are consistent with MD impression as pt demonstrates R hip weakness and decrease in functional mobility. LEFS shows severe disability in performance of home ADLs and higher level activities. Pt would benefit from skilled PT services working on improving hip strength and gait tolerance in order to decrease pain.   OBJECTIVE IMPAIRMENTS: Abnormal gait, decreased activity tolerance, decreased balance, decreased mobility, difficulty walking, decreased ROM, decreased strength, and pain   ACTIVITY LIMITATIONS: carrying, lifting, standing, squatting, stairs, transfers, and locomotion  level  PARTICIPATION LIMITATIONS: shopping, community activity, occupation, and yard work  PERSONAL FACTORS: 3+ comorbidities: Liposarcoma removal R thigh 2003, DM II, HTN, Fibromyalgia  are also affecting patient's functional outcome.   REHAB POTENTIAL: Good  CLINICAL DECISION MAKING: Stable/uncomplicated  EVALUATION COMPLEXITY: Low   GOALS: Goals reviewed with patient? No  SHORT TERM GOALS: Target date: 10/09/2023   Pt will be compliant and knowledgeable with initial HEP for improved comfort and carryover Baseline: initial HEP given  Goal status: MET  2.  Pt will self report R hip pain no greater than 6/10 for improved comfort and functional ability Baseline: 9/10 at worst 10/23/2023: 0/10 Goal status: MET   LONG TERM GOALS: Target date: 11/13/2023   Pt will improve LEFS to no less than 35/80 as proxy for functional improvement with home ADLs and higher level community activity Baseline: 27/80 10/23/2023: 60/80 Goal status: MET   2.  Pt will self report R hip pain no greater than 3/10 for improved comfort and functional ability Baseline: 9/10 at worst 10/23/2023: 0/10 Goal status: MET   3.  Pt will increase 30 Second Sit to Stand rep count to no less than 10 reps for improved balance, strength, and functional mobility Baseline: 8 reps with UE 10/23/2023: 12 reps with UE Goal status: MET   4.  Pt will improve standing and walking activity tolerance to no less than 30 minutes for improved community navigation and ability to go shopping Baseline: 10 minutes with AD 10/23/2023: 30 minutes - subjectively  Goal status: MET  5.  Pt will improve R hip IR ROM to no less than 17 degrees for improved functional mobility and decrease pain Baseline: 11 degrees 10/23/2023: 18 degrees Goal status: MET    PLAN:  PT FREQUENCY: 1x/week  PT DURATION: 8 weeks  PLANNED INTERVENTIONS: 97164- PT Re-evaluation, 97110-Therapeutic exercises, 97530- Therapeutic activity, 97112- Neuromuscular  re-education, 97535- Self Care, 09811- Manual therapy, [id number]- Gait training, B1478- Electrical stimulation (unattended), [id number]-  Electrical stimulation (manual), Dry Needling, Cryotherapy, and Moist heat  PLAN FOR NEXT SESSION: assess HEP response, hip strengthening, gait training, IR stretching  Referring diagnosis?  M25.551 (ICD-10-CM) - Pain in right hip  Treatment diagnosis? (if different than referring diagnosis)  Pain in right hip Muscle weakness (generalized) Other abnormalities of gait and mobility  What was this (referring dx) caused by? []  Surgery []  Fall []  Ongoing issue [x]  Arthritis []  Other: ____________  Laterality: [x]  Rt []  Lt []  Both  Check all possible CPT codes:  *CHOOSE 10 OR LESS*    See Planned Interventions listed in the Plan section of the Evaluation.    Ivor Mars, PT 10/23/2023, 1:50 PM

## 2023-10-30 DIAGNOSIS — M25551 Pain in right hip: Secondary | ICD-10-CM | POA: Diagnosis not present

## 2024-02-20 ENCOUNTER — Other Ambulatory Visit: Payer: Self-pay | Admitting: Internal Medicine

## 2024-02-20 DIAGNOSIS — Z1231 Encounter for screening mammogram for malignant neoplasm of breast: Secondary | ICD-10-CM

## 2024-03-21 ENCOUNTER — Ambulatory Visit
Admission: RE | Admit: 2024-03-21 | Discharge: 2024-03-21 | Disposition: A | Source: Ambulatory Visit | Attending: Internal Medicine | Admitting: Internal Medicine

## 2024-03-21 DIAGNOSIS — Z1231 Encounter for screening mammogram for malignant neoplasm of breast: Secondary | ICD-10-CM

## 2024-04-16 DIAGNOSIS — Z23 Encounter for immunization: Secondary | ICD-10-CM | POA: Diagnosis not present

## 2024-04-16 DIAGNOSIS — M109 Gout, unspecified: Secondary | ICD-10-CM | POA: Diagnosis not present

## 2024-04-16 DIAGNOSIS — C499 Malignant neoplasm of connective and soft tissue, unspecified: Secondary | ICD-10-CM | POA: Diagnosis not present

## 2024-04-16 DIAGNOSIS — K219 Gastro-esophageal reflux disease without esophagitis: Secondary | ICD-10-CM | POA: Diagnosis not present

## 2024-04-16 DIAGNOSIS — N1831 Chronic kidney disease, stage 3a: Secondary | ICD-10-CM | POA: Diagnosis not present

## 2024-04-16 DIAGNOSIS — E782 Mixed hyperlipidemia: Secondary | ICD-10-CM | POA: Diagnosis not present

## 2024-04-16 DIAGNOSIS — G720 Drug-induced myopathy: Secondary | ICD-10-CM | POA: Diagnosis not present

## 2024-04-16 DIAGNOSIS — I10 Essential (primary) hypertension: Secondary | ICD-10-CM | POA: Diagnosis not present

## 2024-04-16 DIAGNOSIS — F419 Anxiety disorder, unspecified: Secondary | ICD-10-CM | POA: Diagnosis not present

## 2024-04-16 DIAGNOSIS — Z0000 Encounter for general adult medical examination without abnormal findings: Secondary | ICD-10-CM | POA: Diagnosis not present

## 2024-04-16 DIAGNOSIS — E1122 Type 2 diabetes mellitus with diabetic chronic kidney disease: Secondary | ICD-10-CM | POA: Diagnosis not present

## 2024-05-30 DIAGNOSIS — I10 Essential (primary) hypertension: Secondary | ICD-10-CM | POA: Diagnosis not present

## 2024-05-30 DIAGNOSIS — M546 Pain in thoracic spine: Secondary | ICD-10-CM | POA: Diagnosis not present

## 2024-05-30 DIAGNOSIS — T148XXA Other injury of unspecified body region, initial encounter: Secondary | ICD-10-CM | POA: Diagnosis not present
